# Patient Record
Sex: Female | Born: 1935 | Race: White | Hispanic: No | Smoking: Never smoker
Health system: Southern US, Community
[De-identification: ages and names within clinical notes are randomized; demographics above are authoritative.]

## PROBLEM LIST (undated history)

## (undated) DIAGNOSIS — M810 Age-related osteoporosis without current pathological fracture: Secondary | ICD-10-CM

## (undated) DIAGNOSIS — E119 Type 2 diabetes mellitus without complications: Secondary | ICD-10-CM

## (undated) DIAGNOSIS — I739 Peripheral vascular disease, unspecified: Secondary | ICD-10-CM

## (undated) DIAGNOSIS — N951 Menopausal and female climacteric states: Secondary | ICD-10-CM

## (undated) DIAGNOSIS — I10 Essential (primary) hypertension: Secondary | ICD-10-CM

## (undated) HISTORY — DX: Menopausal and female climacteric states: N95.1

## (undated) HISTORY — DX: Type 2 diabetes mellitus without complications: E11.9

## (undated) HISTORY — DX: Peripheral vascular disease, unspecified: I73.9

## (undated) HISTORY — DX: Essential (primary) hypertension: I10

## (undated) HISTORY — DX: Age-related osteoporosis without current pathological fracture: M81.0

---

## 1998-03-06 ENCOUNTER — Ambulatory Visit (HOSPITAL_COMMUNITY): Admission: RE | Admit: 1998-03-06 | Discharge: 1998-03-06 | Payer: Self-pay | Admitting: Otolaryngology

## 1999-03-15 ENCOUNTER — Ambulatory Visit (HOSPITAL_COMMUNITY): Admission: RE | Admit: 1999-03-15 | Discharge: 1999-03-15 | Payer: Self-pay | Admitting: *Deleted

## 2000-05-06 ENCOUNTER — Other Ambulatory Visit: Admission: RE | Admit: 2000-05-06 | Discharge: 2000-05-06 | Payer: Self-pay | Admitting: *Deleted

## 2002-05-07 ENCOUNTER — Encounter: Admission: RE | Admit: 2002-05-07 | Discharge: 2002-05-07 | Payer: Self-pay | Admitting: Internal Medicine

## 2002-05-07 ENCOUNTER — Encounter: Payer: Self-pay | Admitting: Internal Medicine

## 2003-06-09 ENCOUNTER — Ambulatory Visit (HOSPITAL_COMMUNITY): Admission: RE | Admit: 2003-06-09 | Discharge: 2003-06-09 | Payer: Self-pay | Admitting: Internal Medicine

## 2003-07-18 ENCOUNTER — Other Ambulatory Visit: Admission: RE | Admit: 2003-07-18 | Discharge: 2003-07-18 | Payer: Self-pay | Admitting: Internal Medicine

## 2004-06-01 ENCOUNTER — Ambulatory Visit (HOSPITAL_COMMUNITY): Admission: RE | Admit: 2004-06-01 | Discharge: 2004-06-01 | Payer: Self-pay | Admitting: *Deleted

## 2004-09-06 ENCOUNTER — Ambulatory Visit (HOSPITAL_COMMUNITY): Admission: RE | Admit: 2004-09-06 | Discharge: 2004-09-06 | Payer: Self-pay | Admitting: Internal Medicine

## 2005-11-04 ENCOUNTER — Ambulatory Visit (HOSPITAL_COMMUNITY): Admission: RE | Admit: 2005-11-04 | Discharge: 2005-11-04 | Payer: Self-pay | Admitting: Internal Medicine

## 2005-11-18 ENCOUNTER — Encounter: Admission: RE | Admit: 2005-11-18 | Discharge: 2005-11-18 | Payer: Self-pay | Admitting: Internal Medicine

## 2006-07-22 ENCOUNTER — Other Ambulatory Visit: Admission: RE | Admit: 2006-07-22 | Discharge: 2006-07-22 | Payer: Self-pay | Admitting: Internal Medicine

## 2006-12-08 ENCOUNTER — Ambulatory Visit (HOSPITAL_COMMUNITY): Admission: RE | Admit: 2006-12-08 | Discharge: 2006-12-08 | Payer: Self-pay | Admitting: Internal Medicine

## 2007-12-24 ENCOUNTER — Ambulatory Visit (HOSPITAL_COMMUNITY): Admission: RE | Admit: 2007-12-24 | Discharge: 2007-12-24 | Payer: Self-pay | Admitting: Internal Medicine

## 2009-01-16 ENCOUNTER — Ambulatory Visit (HOSPITAL_COMMUNITY): Admission: RE | Admit: 2009-01-16 | Discharge: 2009-01-16 | Payer: Self-pay | Admitting: Internal Medicine

## 2010-01-29 ENCOUNTER — Ambulatory Visit (HOSPITAL_COMMUNITY): Admission: RE | Admit: 2010-01-29 | Discharge: 2010-01-29 | Payer: Self-pay | Admitting: Internal Medicine

## 2010-07-16 ENCOUNTER — Encounter: Admission: RE | Admit: 2010-07-16 | Discharge: 2010-07-16 | Payer: Self-pay | Admitting: Internal Medicine

## 2010-09-24 ENCOUNTER — Other Ambulatory Visit: Payer: Self-pay | Admitting: Internal Medicine

## 2010-09-26 ENCOUNTER — Other Ambulatory Visit: Payer: Self-pay | Admitting: Internal Medicine

## 2010-09-26 DIAGNOSIS — E042 Nontoxic multinodular goiter: Secondary | ICD-10-CM

## 2010-10-02 ENCOUNTER — Ambulatory Visit
Admission: RE | Admit: 2010-10-02 | Discharge: 2010-10-02 | Disposition: A | Discharge status: Home / Self Care | Payer: Medicare Other | Source: Ambulatory Visit | Attending: Internal Medicine | Admitting: Internal Medicine

## 2010-10-02 DIAGNOSIS — E042 Nontoxic multinodular goiter: Secondary | ICD-10-CM

## 2010-12-28 NOTE — Op Note (Signed)
[name], Angel Forbes                 ACCOUNT NO.:  [id number]   MEDICAL RECORD NO.:  [id number]          PATIENT TYPE:  AMB   LOCATION:  ENDO                         FACILITY:  [facility]   PHYSICIAN:  Georgiana Spinner, M.D.    DATE OF BIRTH:  1929-08-15   DATE OF PROCEDURE:  06/01/2004  DATE OF DISCHARGE:                                 OPERATIVE REPORT   PROCEDURE:  Colonoscopy.   INDICATIONS:  Colon cancer screening.   ANESTHESIA:  Demerol 50 mg, Versed 5 mg.   PROCEDURE:  With the patient mildly sedated in the left lateral decubitus  position, the Olympus videoscopic colonoscope was inserted in the rectum and  passed under direct vision to the cecum, identified by the ileocecal valve  and appendiceal oropharynx, both of which were photographed. From this  point, the colonoscope was slowly withdrawn, taking circumferential views of  the colonic mucosa, stopping only in the rectum which appeared normal on  direct and retroflexed view. The endoscope was then straightened and  withdrawn. The patient's vital signs and pulse oximetry remained stable. The  patient tolerated the procedure well without apparent complications.   FINDINGS:  Occasional diverticulum in the sigmoid colon, otherwise an  unremarkable examination.   PLAN:  Have patient follow up with me in approximately 5 to 10 years.      GMO/MEDQ  D:  06/01/2004  T:  06/01/2004  Job:  [id number]

## 2011-01-29 ENCOUNTER — Other Ambulatory Visit (HOSPITAL_COMMUNITY): Payer: Self-pay | Admitting: Internal Medicine

## 2011-01-29 DIAGNOSIS — Z1231 Encounter for screening mammogram for malignant neoplasm of breast: Secondary | ICD-10-CM

## 2011-02-08 ENCOUNTER — Ambulatory Visit (HOSPITAL_COMMUNITY)
Admission: RE | Admit: 2011-02-08 | Discharge: 2011-02-08 | Disposition: A | Discharge status: Home / Self Care | Payer: Medicare Other | Source: Ambulatory Visit | Attending: Internal Medicine | Admitting: Internal Medicine

## 2011-02-08 DIAGNOSIS — Z1231 Encounter for screening mammogram for malignant neoplasm of breast: Secondary | ICD-10-CM | POA: Insufficient documentation

## 2011-10-07 DIAGNOSIS — H35359 Cystoid macular degeneration, unspecified eye: Secondary | ICD-10-CM | POA: Diagnosis not present

## 2011-10-07 DIAGNOSIS — H26499 Other secondary cataract, unspecified eye: Secondary | ICD-10-CM | POA: Diagnosis not present

## 2011-10-07 DIAGNOSIS — H35039 Hypertensive retinopathy, unspecified eye: Secondary | ICD-10-CM | POA: Diagnosis not present

## 2011-10-07 DIAGNOSIS — H348392 Tributary (branch) retinal vein occlusion, unspecified eye, stable: Secondary | ICD-10-CM | POA: Diagnosis not present

## 2011-10-07 DIAGNOSIS — H524 Presbyopia: Secondary | ICD-10-CM | POA: Diagnosis not present

## 2011-10-07 DIAGNOSIS — H40019 Open angle with borderline findings, low risk, unspecified eye: Secondary | ICD-10-CM | POA: Diagnosis not present

## 2011-10-31 DIAGNOSIS — E559 Vitamin D deficiency, unspecified: Secondary | ICD-10-CM | POA: Diagnosis not present

## 2011-10-31 DIAGNOSIS — E119 Type 2 diabetes mellitus without complications: Secondary | ICD-10-CM | POA: Diagnosis not present

## 2011-10-31 DIAGNOSIS — N390 Urinary tract infection, site not specified: Secondary | ICD-10-CM | POA: Diagnosis not present

## 2011-11-05 DIAGNOSIS — M899 Disorder of bone, unspecified: Secondary | ICD-10-CM | POA: Diagnosis not present

## 2011-11-05 DIAGNOSIS — E119 Type 2 diabetes mellitus without complications: Secondary | ICD-10-CM | POA: Diagnosis not present

## 2011-11-05 DIAGNOSIS — I10 Essential (primary) hypertension: Secondary | ICD-10-CM | POA: Diagnosis not present

## 2011-11-05 DIAGNOSIS — M949 Disorder of cartilage, unspecified: Secondary | ICD-10-CM | POA: Diagnosis not present

## 2011-11-11 ENCOUNTER — Other Ambulatory Visit: Payer: Self-pay | Admitting: Internal Medicine

## 2011-11-11 DIAGNOSIS — E041 Nontoxic single thyroid nodule: Secondary | ICD-10-CM

## 2011-12-02 ENCOUNTER — Ambulatory Visit
Admission: RE | Admit: 2011-12-02 | Discharge: 2011-12-02 | Disposition: A | Discharge status: Home / Self Care | Payer: Medicare Other | Source: Ambulatory Visit | Attending: Internal Medicine | Admitting: Internal Medicine

## 2011-12-02 DIAGNOSIS — E042 Nontoxic multinodular goiter: Secondary | ICD-10-CM | POA: Diagnosis not present

## 2011-12-02 DIAGNOSIS — E041 Nontoxic single thyroid nodule: Secondary | ICD-10-CM

## 2012-01-27 ENCOUNTER — Other Ambulatory Visit (HOSPITAL_COMMUNITY): Payer: Self-pay | Admitting: Internal Medicine

## 2012-01-27 DIAGNOSIS — Z1231 Encounter for screening mammogram for malignant neoplasm of breast: Secondary | ICD-10-CM

## 2012-02-24 ENCOUNTER — Ambulatory Visit (HOSPITAL_COMMUNITY)
Admission: RE | Admit: 2012-02-24 | Discharge: 2012-02-24 | Disposition: A | Discharge status: Home / Self Care | Payer: Medicare Other | Source: Ambulatory Visit | Attending: Internal Medicine | Admitting: Internal Medicine

## 2012-02-24 DIAGNOSIS — Z1231 Encounter for screening mammogram for malignant neoplasm of breast: Secondary | ICD-10-CM

## 2012-03-04 DIAGNOSIS — H40019 Open angle with borderline findings, low risk, unspecified eye: Secondary | ICD-10-CM | POA: Diagnosis not present

## 2012-03-04 DIAGNOSIS — H02409 Unspecified ptosis of unspecified eyelid: Secondary | ICD-10-CM | POA: Diagnosis not present

## 2012-05-04 DIAGNOSIS — D509 Iron deficiency anemia, unspecified: Secondary | ICD-10-CM | POA: Diagnosis not present

## 2012-05-04 DIAGNOSIS — E119 Type 2 diabetes mellitus without complications: Secondary | ICD-10-CM | POA: Diagnosis not present

## 2012-05-07 DIAGNOSIS — D509 Iron deficiency anemia, unspecified: Secondary | ICD-10-CM | POA: Diagnosis not present

## 2012-05-07 DIAGNOSIS — E119 Type 2 diabetes mellitus without complications: Secondary | ICD-10-CM | POA: Diagnosis not present

## 2012-05-07 DIAGNOSIS — Z23 Encounter for immunization: Secondary | ICD-10-CM | POA: Diagnosis not present

## 2012-05-07 DIAGNOSIS — I10 Essential (primary) hypertension: Secondary | ICD-10-CM | POA: Diagnosis not present

## 2012-05-19 DIAGNOSIS — Z1212 Encounter for screening for malignant neoplasm of rectum: Secondary | ICD-10-CM | POA: Diagnosis not present

## 2012-11-09 DIAGNOSIS — D509 Iron deficiency anemia, unspecified: Secondary | ICD-10-CM | POA: Diagnosis not present

## 2012-11-09 DIAGNOSIS — E119 Type 2 diabetes mellitus without complications: Secondary | ICD-10-CM | POA: Diagnosis not present

## 2012-11-09 DIAGNOSIS — N390 Urinary tract infection, site not specified: Secondary | ICD-10-CM | POA: Diagnosis not present

## 2012-11-09 DIAGNOSIS — E559 Vitamin D deficiency, unspecified: Secondary | ICD-10-CM | POA: Diagnosis not present

## 2012-11-12 DIAGNOSIS — E785 Hyperlipidemia, unspecified: Secondary | ICD-10-CM | POA: Diagnosis not present

## 2012-11-12 DIAGNOSIS — E119 Type 2 diabetes mellitus without complications: Secondary | ICD-10-CM | POA: Diagnosis not present

## 2012-11-12 DIAGNOSIS — E559 Vitamin D deficiency, unspecified: Secondary | ICD-10-CM | POA: Diagnosis not present

## 2012-11-12 DIAGNOSIS — Z0000 Encounter for general adult medical examination without abnormal findings: Secondary | ICD-10-CM | POA: Diagnosis not present

## 2013-02-09 DIAGNOSIS — E119 Type 2 diabetes mellitus without complications: Secondary | ICD-10-CM | POA: Diagnosis not present

## 2013-02-09 DIAGNOSIS — E785 Hyperlipidemia, unspecified: Secondary | ICD-10-CM | POA: Diagnosis not present

## 2013-02-09 DIAGNOSIS — N390 Urinary tract infection, site not specified: Secondary | ICD-10-CM | POA: Diagnosis not present

## 2013-02-17 ENCOUNTER — Other Ambulatory Visit (HOSPITAL_COMMUNITY): Payer: Self-pay | Admitting: Internal Medicine

## 2013-02-17 DIAGNOSIS — Z1231 Encounter for screening mammogram for malignant neoplasm of breast: Secondary | ICD-10-CM

## 2013-03-04 ENCOUNTER — Ambulatory Visit (HOSPITAL_COMMUNITY)
Admission: RE | Admit: 2013-03-04 | Discharge: 2013-03-04 | Disposition: A | Discharge status: Home / Self Care | Payer: Medicare Other | Source: Ambulatory Visit | Attending: Internal Medicine | Admitting: Internal Medicine

## 2013-03-04 DIAGNOSIS — Z1231 Encounter for screening mammogram for malignant neoplasm of breast: Secondary | ICD-10-CM | POA: Insufficient documentation

## 2013-05-13 DIAGNOSIS — E785 Hyperlipidemia, unspecified: Secondary | ICD-10-CM | POA: Diagnosis not present

## 2013-05-13 DIAGNOSIS — N390 Urinary tract infection, site not specified: Secondary | ICD-10-CM | POA: Diagnosis not present

## 2013-05-13 DIAGNOSIS — E119 Type 2 diabetes mellitus without complications: Secondary | ICD-10-CM | POA: Diagnosis not present

## 2013-05-18 DIAGNOSIS — E119 Type 2 diabetes mellitus without complications: Secondary | ICD-10-CM | POA: Diagnosis not present

## 2013-05-18 DIAGNOSIS — Z23 Encounter for immunization: Secondary | ICD-10-CM | POA: Diagnosis not present

## 2013-05-18 DIAGNOSIS — I10 Essential (primary) hypertension: Secondary | ICD-10-CM | POA: Diagnosis not present

## 2013-12-31 DIAGNOSIS — E119 Type 2 diabetes mellitus without complications: Secondary | ICD-10-CM | POA: Diagnosis not present

## 2013-12-31 DIAGNOSIS — E785 Hyperlipidemia, unspecified: Secondary | ICD-10-CM | POA: Diagnosis not present

## 2014-01-06 DIAGNOSIS — Z1331 Encounter for screening for depression: Secondary | ICD-10-CM | POA: Diagnosis not present

## 2014-01-06 DIAGNOSIS — I10 Essential (primary) hypertension: Secondary | ICD-10-CM | POA: Diagnosis not present

## 2014-01-06 DIAGNOSIS — Z0000 Encounter for general adult medical examination without abnormal findings: Secondary | ICD-10-CM | POA: Diagnosis not present

## 2014-01-06 DIAGNOSIS — E1129 Type 2 diabetes mellitus with other diabetic kidney complication: Secondary | ICD-10-CM | POA: Diagnosis not present

## 2014-02-17 ENCOUNTER — Other Ambulatory Visit (HOSPITAL_COMMUNITY): Payer: Self-pay | Admitting: Internal Medicine

## 2014-02-17 DIAGNOSIS — Z1231 Encounter for screening mammogram for malignant neoplasm of breast: Secondary | ICD-10-CM

## 2014-03-07 ENCOUNTER — Ambulatory Visit (HOSPITAL_COMMUNITY)
Admission: RE | Admit: 2014-03-07 | Discharge: 2014-03-07 | Disposition: A | Discharge status: Home / Self Care | Payer: Medicare Other | Source: Ambulatory Visit | Attending: Internal Medicine | Admitting: Internal Medicine

## 2014-03-07 DIAGNOSIS — Z1231 Encounter for screening mammogram for malignant neoplasm of breast: Secondary | ICD-10-CM | POA: Insufficient documentation

## 2014-03-25 DIAGNOSIS — H40019 Open angle with borderline findings, low risk, unspecified eye: Secondary | ICD-10-CM | POA: Diagnosis not present

## 2014-03-25 DIAGNOSIS — H35359 Cystoid macular degeneration, unspecified eye: Secondary | ICD-10-CM | POA: Diagnosis not present

## 2014-03-25 DIAGNOSIS — E118 Type 2 diabetes mellitus with unspecified complications: Secondary | ICD-10-CM | POA: Diagnosis not present

## 2014-03-25 DIAGNOSIS — H35039 Hypertensive retinopathy, unspecified eye: Secondary | ICD-10-CM | POA: Diagnosis not present

## 2014-03-25 DIAGNOSIS — H524 Presbyopia: Secondary | ICD-10-CM | POA: Diagnosis not present

## 2014-03-25 DIAGNOSIS — H26499 Other secondary cataract, unspecified eye: Secondary | ICD-10-CM | POA: Diagnosis not present

## 2014-04-19 DIAGNOSIS — E11329 Type 2 diabetes mellitus with mild nonproliferative diabetic retinopathy without macular edema: Secondary | ICD-10-CM | POA: Diagnosis not present

## 2014-04-19 DIAGNOSIS — H35049 Retinal micro-aneurysms, unspecified, unspecified eye: Secondary | ICD-10-CM | POA: Diagnosis not present

## 2014-04-19 DIAGNOSIS — H3560 Retinal hemorrhage, unspecified eye: Secondary | ICD-10-CM | POA: Diagnosis not present

## 2014-04-19 DIAGNOSIS — E1139 Type 2 diabetes mellitus with other diabetic ophthalmic complication: Secondary | ICD-10-CM | POA: Diagnosis not present

## 2014-05-09 DIAGNOSIS — H348392 Tributary (branch) retinal vein occlusion, unspecified eye, stable: Secondary | ICD-10-CM | POA: Diagnosis not present

## 2014-05-09 DIAGNOSIS — H3581 Retinal edema: Secondary | ICD-10-CM | POA: Diagnosis not present

## 2014-05-09 DIAGNOSIS — E11329 Type 2 diabetes mellitus with mild nonproliferative diabetic retinopathy without macular edema: Secondary | ICD-10-CM | POA: Diagnosis not present

## 2014-05-09 DIAGNOSIS — E1139 Type 2 diabetes mellitus with other diabetic ophthalmic complication: Secondary | ICD-10-CM | POA: Diagnosis not present

## 2014-05-12 DIAGNOSIS — H34832 Tributary (branch) retinal vein occlusion, left eye: Secondary | ICD-10-CM | POA: Diagnosis not present

## 2014-05-12 DIAGNOSIS — H3581 Retinal edema: Secondary | ICD-10-CM | POA: Diagnosis not present

## 2014-06-11 DIAGNOSIS — Z23 Encounter for immunization: Secondary | ICD-10-CM | POA: Diagnosis not present

## 2014-06-20 DIAGNOSIS — H34832 Tributary (branch) retinal vein occlusion, left eye: Secondary | ICD-10-CM | POA: Diagnosis not present

## 2014-07-06 DIAGNOSIS — E1129 Type 2 diabetes mellitus with other diabetic kidney complication: Secondary | ICD-10-CM | POA: Diagnosis not present

## 2014-07-06 DIAGNOSIS — R809 Proteinuria, unspecified: Secondary | ICD-10-CM | POA: Diagnosis not present

## 2014-07-06 DIAGNOSIS — E559 Vitamin D deficiency, unspecified: Secondary | ICD-10-CM | POA: Diagnosis not present

## 2014-07-12 DIAGNOSIS — R809 Proteinuria, unspecified: Secondary | ICD-10-CM | POA: Diagnosis not present

## 2014-07-12 DIAGNOSIS — H6123 Impacted cerumen, bilateral: Secondary | ICD-10-CM | POA: Diagnosis not present

## 2014-07-12 DIAGNOSIS — E1165 Type 2 diabetes mellitus with hyperglycemia: Secondary | ICD-10-CM | POA: Diagnosis not present

## 2014-08-09 DIAGNOSIS — H3581 Retinal edema: Secondary | ICD-10-CM | POA: Diagnosis not present

## 2014-08-09 DIAGNOSIS — H34832 Tributary (branch) retinal vein occlusion, left eye: Secondary | ICD-10-CM | POA: Diagnosis not present

## 2014-10-03 DIAGNOSIS — H34832 Tributary (branch) retinal vein occlusion, left eye: Secondary | ICD-10-CM | POA: Diagnosis not present

## 2014-12-12 DIAGNOSIS — H34832 Tributary (branch) retinal vein occlusion, left eye: Secondary | ICD-10-CM | POA: Diagnosis not present

## 2014-12-13 DIAGNOSIS — R809 Proteinuria, unspecified: Secondary | ICD-10-CM | POA: Diagnosis not present

## 2014-12-13 DIAGNOSIS — E785 Hyperlipidemia, unspecified: Secondary | ICD-10-CM | POA: Diagnosis not present

## 2014-12-13 DIAGNOSIS — E1165 Type 2 diabetes mellitus with hyperglycemia: Secondary | ICD-10-CM | POA: Diagnosis not present

## 2014-12-19 DIAGNOSIS — Z0000 Encounter for general adult medical examination without abnormal findings: Secondary | ICD-10-CM | POA: Diagnosis not present

## 2014-12-19 DIAGNOSIS — Z23 Encounter for immunization: Secondary | ICD-10-CM | POA: Diagnosis not present

## 2014-12-19 DIAGNOSIS — Z1389 Encounter for screening for other disorder: Secondary | ICD-10-CM | POA: Diagnosis not present

## 2014-12-19 DIAGNOSIS — E1165 Type 2 diabetes mellitus with hyperglycemia: Secondary | ICD-10-CM | POA: Diagnosis not present

## 2014-12-19 DIAGNOSIS — M8580 Other specified disorders of bone density and structure, unspecified site: Secondary | ICD-10-CM | POA: Diagnosis not present

## 2015-02-10 ENCOUNTER — Other Ambulatory Visit (HOSPITAL_COMMUNITY): Payer: Self-pay | Admitting: Internal Medicine

## 2015-02-10 DIAGNOSIS — Z1231 Encounter for screening mammogram for malignant neoplasm of breast: Secondary | ICD-10-CM

## 2015-03-13 ENCOUNTER — Ambulatory Visit (HOSPITAL_COMMUNITY)
Admission: RE | Admit: 2015-03-13 | Discharge: 2015-03-13 | Disposition: A | Discharge status: Home / Self Care | Payer: Medicare Other | Source: Ambulatory Visit | Attending: Internal Medicine | Admitting: Internal Medicine

## 2015-03-13 ENCOUNTER — Ambulatory Visit (HOSPITAL_COMMUNITY): Payer: Medicare Other

## 2015-03-13 DIAGNOSIS — Z1231 Encounter for screening mammogram for malignant neoplasm of breast: Secondary | ICD-10-CM | POA: Insufficient documentation

## 2015-03-13 DIAGNOSIS — H34832 Tributary (branch) retinal vein occlusion, left eye: Secondary | ICD-10-CM | POA: Diagnosis not present

## 2015-03-13 DIAGNOSIS — E11329 Type 2 diabetes mellitus with mild nonproliferative diabetic retinopathy without macular edema: Secondary | ICD-10-CM | POA: Diagnosis not present

## 2015-03-13 DIAGNOSIS — H3581 Retinal edema: Secondary | ICD-10-CM | POA: Diagnosis not present

## 2015-05-26 DIAGNOSIS — Z23 Encounter for immunization: Secondary | ICD-10-CM | POA: Diagnosis not present

## 2015-06-12 DIAGNOSIS — E049 Nontoxic goiter, unspecified: Secondary | ICD-10-CM | POA: Diagnosis not present

## 2015-06-12 DIAGNOSIS — E559 Vitamin D deficiency, unspecified: Secondary | ICD-10-CM | POA: Diagnosis not present

## 2015-06-12 DIAGNOSIS — N390 Urinary tract infection, site not specified: Secondary | ICD-10-CM | POA: Diagnosis not present

## 2015-06-12 DIAGNOSIS — M8580 Other specified disorders of bone density and structure, unspecified site: Secondary | ICD-10-CM | POA: Diagnosis not present

## 2015-06-12 DIAGNOSIS — E1165 Type 2 diabetes mellitus with hyperglycemia: Secondary | ICD-10-CM | POA: Diagnosis not present

## 2015-06-12 DIAGNOSIS — I10 Essential (primary) hypertension: Secondary | ICD-10-CM | POA: Diagnosis not present

## 2015-06-19 DIAGNOSIS — Z8249 Family history of ischemic heart disease and other diseases of the circulatory system: Secondary | ICD-10-CM | POA: Diagnosis not present

## 2015-06-19 DIAGNOSIS — I10 Essential (primary) hypertension: Secondary | ICD-10-CM | POA: Diagnosis not present

## 2015-06-19 DIAGNOSIS — E559 Vitamin D deficiency, unspecified: Secondary | ICD-10-CM | POA: Diagnosis not present

## 2015-06-19 DIAGNOSIS — E1129 Type 2 diabetes mellitus with other diabetic kidney complication: Secondary | ICD-10-CM | POA: Diagnosis not present

## 2015-07-17 DIAGNOSIS — E113293 Type 2 diabetes mellitus with mild nonproliferative diabetic retinopathy without macular edema, bilateral: Secondary | ICD-10-CM | POA: Diagnosis not present

## 2015-07-17 DIAGNOSIS — H34832 Tributary (branch) retinal vein occlusion, left eye, with macular edema: Secondary | ICD-10-CM | POA: Diagnosis not present

## 2015-07-17 DIAGNOSIS — H3581 Retinal edema: Secondary | ICD-10-CM | POA: Diagnosis not present

## 2015-08-03 DIAGNOSIS — Z0000 Encounter for general adult medical examination without abnormal findings: Secondary | ICD-10-CM | POA: Diagnosis not present

## 2015-08-03 DIAGNOSIS — I10 Essential (primary) hypertension: Secondary | ICD-10-CM | POA: Diagnosis not present

## 2015-08-03 DIAGNOSIS — E1165 Type 2 diabetes mellitus with hyperglycemia: Secondary | ICD-10-CM | POA: Diagnosis not present

## 2015-08-03 DIAGNOSIS — E559 Vitamin D deficiency, unspecified: Secondary | ICD-10-CM | POA: Diagnosis not present

## 2015-08-03 DIAGNOSIS — N390 Urinary tract infection, site not specified: Secondary | ICD-10-CM | POA: Diagnosis not present

## 2015-08-03 DIAGNOSIS — M8580 Other specified disorders of bone density and structure, unspecified site: Secondary | ICD-10-CM | POA: Diagnosis not present

## 2015-08-10 DIAGNOSIS — M8580 Other specified disorders of bone density and structure, unspecified site: Secondary | ICD-10-CM | POA: Diagnosis not present

## 2015-08-10 DIAGNOSIS — I10 Essential (primary) hypertension: Secondary | ICD-10-CM | POA: Diagnosis not present

## 2015-08-10 DIAGNOSIS — H6121 Impacted cerumen, right ear: Secondary | ICD-10-CM | POA: Diagnosis not present

## 2015-08-10 DIAGNOSIS — Z1212 Encounter for screening for malignant neoplasm of rectum: Secondary | ICD-10-CM | POA: Diagnosis not present

## 2015-08-10 DIAGNOSIS — R350 Frequency of micturition: Secondary | ICD-10-CM | POA: Diagnosis not present

## 2015-08-10 DIAGNOSIS — E042 Nontoxic multinodular goiter: Secondary | ICD-10-CM | POA: Diagnosis not present

## 2015-08-28 DIAGNOSIS — M79674 Pain in right toe(s): Secondary | ICD-10-CM | POA: Diagnosis not present

## 2015-08-28 DIAGNOSIS — L03031 Cellulitis of right toe: Secondary | ICD-10-CM | POA: Diagnosis not present

## 2015-10-05 DIAGNOSIS — H26491 Other secondary cataract, right eye: Secondary | ICD-10-CM | POA: Diagnosis not present

## 2015-10-05 DIAGNOSIS — E118 Type 2 diabetes mellitus with unspecified complications: Secondary | ICD-10-CM | POA: Diagnosis not present

## 2015-10-05 DIAGNOSIS — H40013 Open angle with borderline findings, low risk, bilateral: Secondary | ICD-10-CM | POA: Diagnosis not present

## 2015-12-06 DIAGNOSIS — L02611 Cutaneous abscess of right foot: Secondary | ICD-10-CM | POA: Diagnosis not present

## 2015-12-06 DIAGNOSIS — M79674 Pain in right toe(s): Secondary | ICD-10-CM | POA: Diagnosis not present

## 2015-12-06 DIAGNOSIS — L03031 Cellulitis of right toe: Secondary | ICD-10-CM | POA: Diagnosis not present

## 2015-12-08 DIAGNOSIS — I10 Essential (primary) hypertension: Secondary | ICD-10-CM | POA: Diagnosis not present

## 2015-12-08 DIAGNOSIS — E119 Type 2 diabetes mellitus without complications: Secondary | ICD-10-CM | POA: Diagnosis not present

## 2015-12-21 DIAGNOSIS — L03031 Cellulitis of right toe: Secondary | ICD-10-CM | POA: Diagnosis not present

## 2016-01-02 DIAGNOSIS — L03032 Cellulitis of left toe: Secondary | ICD-10-CM | POA: Diagnosis not present

## 2016-01-02 DIAGNOSIS — L03031 Cellulitis of right toe: Secondary | ICD-10-CM | POA: Diagnosis not present

## 2016-01-11 DIAGNOSIS — H348322 Tributary (branch) retinal vein occlusion, left eye, stable: Secondary | ICD-10-CM | POA: Diagnosis not present

## 2016-01-11 DIAGNOSIS — E113293 Type 2 diabetes mellitus with mild nonproliferative diabetic retinopathy without macular edema, bilateral: Secondary | ICD-10-CM | POA: Diagnosis not present

## 2016-01-11 DIAGNOSIS — H359 Unspecified retinal disorder: Secondary | ICD-10-CM | POA: Diagnosis not present

## 2016-01-11 DIAGNOSIS — H3581 Retinal edema: Secondary | ICD-10-CM | POA: Diagnosis not present

## 2016-01-18 DIAGNOSIS — M810 Age-related osteoporosis without current pathological fracture: Secondary | ICD-10-CM | POA: Diagnosis not present

## 2016-02-08 DIAGNOSIS — M8580 Other specified disorders of bone density and structure, unspecified site: Secondary | ICD-10-CM | POA: Diagnosis not present

## 2016-03-08 ENCOUNTER — Other Ambulatory Visit: Payer: Self-pay | Admitting: Internal Medicine

## 2016-03-08 DIAGNOSIS — Z1231 Encounter for screening mammogram for malignant neoplasm of breast: Secondary | ICD-10-CM

## 2016-04-01 ENCOUNTER — Ambulatory Visit
Admission: RE | Admit: 2016-04-01 | Discharge: 2016-04-01 | Disposition: A | Discharge status: Home / Self Care | Payer: Medicare Other | Source: Ambulatory Visit | Attending: Internal Medicine | Admitting: Internal Medicine

## 2016-04-01 DIAGNOSIS — Z1231 Encounter for screening mammogram for malignant neoplasm of breast: Secondary | ICD-10-CM | POA: Diagnosis not present

## 2016-04-03 DIAGNOSIS — E119 Type 2 diabetes mellitus without complications: Secondary | ICD-10-CM | POA: Diagnosis not present

## 2016-04-04 DIAGNOSIS — H02402 Unspecified ptosis of left eyelid: Secondary | ICD-10-CM | POA: Diagnosis not present

## 2016-04-04 DIAGNOSIS — H40013 Open angle with borderline findings, low risk, bilateral: Secondary | ICD-10-CM | POA: Diagnosis not present

## 2016-04-09 DIAGNOSIS — M810 Age-related osteoporosis without current pathological fracture: Secondary | ICD-10-CM | POA: Diagnosis not present

## 2016-04-09 DIAGNOSIS — E119 Type 2 diabetes mellitus without complications: Secondary | ICD-10-CM | POA: Diagnosis not present

## 2016-04-09 DIAGNOSIS — I10 Essential (primary) hypertension: Secondary | ICD-10-CM | POA: Diagnosis not present

## 2016-04-09 DIAGNOSIS — Z23 Encounter for immunization: Secondary | ICD-10-CM | POA: Diagnosis not present

## 2016-08-13 DIAGNOSIS — R319 Hematuria, unspecified: Secondary | ICD-10-CM | POA: Diagnosis not present

## 2016-08-13 DIAGNOSIS — M8580 Other specified disorders of bone density and structure, unspecified site: Secondary | ICD-10-CM | POA: Diagnosis not present

## 2016-08-13 DIAGNOSIS — E559 Vitamin D deficiency, unspecified: Secondary | ICD-10-CM | POA: Diagnosis not present

## 2016-08-13 DIAGNOSIS — Z7982 Long term (current) use of aspirin: Secondary | ICD-10-CM | POA: Diagnosis not present

## 2016-08-13 DIAGNOSIS — N390 Urinary tract infection, site not specified: Secondary | ICD-10-CM | POA: Diagnosis not present

## 2016-08-13 DIAGNOSIS — Z0000 Encounter for general adult medical examination without abnormal findings: Secondary | ICD-10-CM | POA: Diagnosis not present

## 2016-08-13 DIAGNOSIS — I10 Essential (primary) hypertension: Secondary | ICD-10-CM | POA: Diagnosis not present

## 2016-08-16 DIAGNOSIS — N390 Urinary tract infection, site not specified: Secondary | ICD-10-CM | POA: Diagnosis not present

## 2016-08-16 DIAGNOSIS — E042 Nontoxic multinodular goiter: Secondary | ICD-10-CM | POA: Diagnosis not present

## 2016-08-16 DIAGNOSIS — M8580 Other specified disorders of bone density and structure, unspecified site: Secondary | ICD-10-CM | POA: Diagnosis not present

## 2016-08-16 DIAGNOSIS — I10 Essential (primary) hypertension: Secondary | ICD-10-CM | POA: Diagnosis not present

## 2016-08-16 DIAGNOSIS — E119 Type 2 diabetes mellitus without complications: Secondary | ICD-10-CM | POA: Diagnosis not present

## 2016-08-16 DIAGNOSIS — Z1212 Encounter for screening for malignant neoplasm of rectum: Secondary | ICD-10-CM | POA: Diagnosis not present

## 2016-08-16 DIAGNOSIS — E1165 Type 2 diabetes mellitus with hyperglycemia: Secondary | ICD-10-CM | POA: Diagnosis not present

## 2016-08-26 DIAGNOSIS — M79661 Pain in right lower leg: Secondary | ICD-10-CM | POA: Diagnosis not present

## 2016-08-27 ENCOUNTER — Encounter (HOSPITAL_COMMUNITY): Payer: Medicare Other

## 2016-08-28 ENCOUNTER — Encounter (HOSPITAL_COMMUNITY): Payer: Medicare Other

## 2016-09-19 DIAGNOSIS — I10 Essential (primary) hypertension: Secondary | ICD-10-CM | POA: Diagnosis not present

## 2016-09-19 DIAGNOSIS — R350 Frequency of micturition: Secondary | ICD-10-CM | POA: Diagnosis not present

## 2016-09-19 DIAGNOSIS — N390 Urinary tract infection, site not specified: Secondary | ICD-10-CM | POA: Diagnosis not present

## 2016-10-07 DIAGNOSIS — H40013 Open angle with borderline findings, low risk, bilateral: Secondary | ICD-10-CM | POA: Diagnosis not present

## 2016-10-07 DIAGNOSIS — E118 Type 2 diabetes mellitus with unspecified complications: Secondary | ICD-10-CM | POA: Diagnosis not present

## 2016-10-07 DIAGNOSIS — H34832 Tributary (branch) retinal vein occlusion, left eye, with macular edema: Secondary | ICD-10-CM | POA: Diagnosis not present

## 2016-10-07 DIAGNOSIS — H26492 Other secondary cataract, left eye: Secondary | ICD-10-CM | POA: Diagnosis not present

## 2016-10-24 DIAGNOSIS — R350 Frequency of micturition: Secondary | ICD-10-CM | POA: Diagnosis not present

## 2016-10-24 DIAGNOSIS — I10 Essential (primary) hypertension: Secondary | ICD-10-CM | POA: Diagnosis not present

## 2016-10-24 DIAGNOSIS — N390 Urinary tract infection, site not specified: Secondary | ICD-10-CM | POA: Diagnosis not present

## 2016-10-24 DIAGNOSIS — R300 Dysuria: Secondary | ICD-10-CM | POA: Diagnosis not present

## 2016-10-25 DIAGNOSIS — M79661 Pain in right lower leg: Secondary | ICD-10-CM | POA: Diagnosis not present

## 2016-11-18 ENCOUNTER — Ambulatory Visit (INDEPENDENT_AMBULATORY_CARE_PROVIDER_SITE_OTHER): Payer: Medicare Other | Admitting: Sports Medicine

## 2016-11-18 ENCOUNTER — Encounter (INDEPENDENT_AMBULATORY_CARE_PROVIDER_SITE_OTHER): Payer: Self-pay

## 2016-11-18 ENCOUNTER — Encounter: Payer: Self-pay | Admitting: Sports Medicine

## 2016-11-18 VITALS — BP 130/70 | Ht 61.0 in | Wt 117.0 lb

## 2016-11-18 DIAGNOSIS — M5416 Radiculopathy, lumbar region: Secondary | ICD-10-CM | POA: Diagnosis present

## 2016-11-18 NOTE — Progress Notes (Signed)
   Subjective:    Patient ID: Angel Forbes, female    DOB: 30-Jul-1930, 5 y.oMarilu [id number]  HPI chief complaint: Right calf pain  Very pleasant 83 year old female comes in today complaining of 3 months of right calf pain. Her pain began after she stepped down from a table awkwardly twisting her right leg. Later that day, she began to experience pain in the right side of her low back with radiating pain down the right thigh into the right calf. Since that time her low back pain and right thigh pain have resolved but her calf pain persists. It is primarily along the lateral calf. It is worse with walking and bowling. Improves with sitting. No pain at night. She takes an occasional Aleve before bowling and thinks that it may help. She denies any bruising or swelling in her calf. No weakness. She did initially noticed some numbness and tingling but that has since resolved. No similar issues in the past.  Past medical history reviewed Medications reviewed Allergies reviewed    Review of Systems As above    Objective:   Physical Exam  Well-developed, well-nourished. No acute distress  Right calf: No soft tissue swelling. No palpable defect. No tenderness to palpation.  Lumbar spine: Full lumbar flexion. Limited extension. No tenderness to palpation.  Neurological exam: Patient has 2/4 patellar reflexes bilaterally. 2/4 Achilles reflex on the left but absent Achilles reflex on the right. 4/5 strength with resisted plantar flexion on the right compared to 5/5 on the left. Good strength with resisted great toe extension and ankle dorsiflexion. Neurovascularly intact distally.      Assessment & Plan:   Right calf pain, likely referred pain from the lumbar spine  Would like to start with some physical therapy at [facility]. Patient will follow-up with me in 6 weeks for reevaluation. She tells me that her bowling league will be coming to a conclusion in 4 weeks. I think she is okay  to continue with activity using pain as her guide while she starts physical therapy. I discussed pursuing imaging of her lumbar spine if her pain does not improve. Call with questions or concerns prior to her follow-up visit.

## 2016-12-24 DIAGNOSIS — G8929 Other chronic pain: Secondary | ICD-10-CM | POA: Diagnosis not present

## 2016-12-24 DIAGNOSIS — M5441 Lumbago with sciatica, right side: Secondary | ICD-10-CM | POA: Diagnosis not present

## 2016-12-27 DIAGNOSIS — M5416 Radiculopathy, lumbar region: Secondary | ICD-10-CM | POA: Diagnosis not present

## 2016-12-30 ENCOUNTER — Encounter: Payer: Self-pay | Admitting: Sports Medicine

## 2016-12-30 ENCOUNTER — Ambulatory Visit (INDEPENDENT_AMBULATORY_CARE_PROVIDER_SITE_OTHER): Payer: Medicare Other | Admitting: Sports Medicine

## 2016-12-30 VITALS — BP 135/69 | Ht 61.0 in | Wt 117.0 lb

## 2016-12-30 DIAGNOSIS — M5416 Radiculopathy, lumbar region: Secondary | ICD-10-CM | POA: Diagnosis not present

## 2016-12-30 NOTE — Progress Notes (Signed)
Subjective: CC: f/u calf pain / lumbar radiculopathy  HPI: Patient is a 83 y.o. female presenting to clinic today for f/u on lumbar radiculopathy.  Since her last visit the patient states that she has improved. She notes her Pain is almost resolved, but still rates it as a 5-10 pain. She describes it as an aching/tired pain. She feels like her strength is well. She denies any shooting pains from her lumbar region down her leg, but does note significant right posterior thigh pain. She noted this after her last visit, but is unsure if she is just more aware of it after the last appointment.  She states the right posterior thigh pain is present after prolonged standing or walking. She rates it as a follicle 10. The patient denies any she will, no numbness or tingling of the lower extremities, no urinary or bowel incontinence.  Patient is going to physical therapy. This is her second week, she's only gone twice, but has noted significant improvement in her symptoms. She has been doing exercises at home. She will intermittently take aspirin as needed for pain, specifically before bowling. Her bowling season is over, but she intermittently plays at this time.   Social History: Never smoker   ROS: All other systems reviewed and are negative.  Past Medical History There are no active problems to display for this patient.   Medications- reviewed and updated Current Outpatient Prescriptions  Medication Sig Dispense Refill  . alendronate (FOSAMAX) 35 MG tablet     . cephALEXin (KEFLEX) 500 MG capsule     . ciprofloxacin (CIPRO) 250 MG tablet     . losartan-hydrochlorothiazide (HYZAAR) 100-25 MG tablet     . metFORMIN (GLUCOPHAGE) 500 MG tablet     . nitrofurantoin, macrocrystal-monohydrate, (MACROBID) 100 MG capsule      No current facility-administered medications for this visit.     Objective: Office vital signs reviewed. BP 135/69   Ht 5\' 1"  (1.549 m)   Wt 117 lb (53.1 kg)   BMI  22.11 kg/m    Physical Examination:  General: Awake, alert, well- nourished, NAD  Right calf: Normal to inspection. No soft tissue swelling noted. No palpable defect. No tenderness to palpation.  Lumbar spine: Normal to inspection. No tenderness to palpation.  Full flexion in the lumbar region, mildly decreased range of motion in extension.  2/4 patellar reflexes bilaterally, unable to illicit Achilles reflexes bilaterally. 5/5 strength in knee flexion/extension and ankle dorsiflexion and plantar flexion. Neurovascularlly intact distally. Negative straight leg raise  R hip: tenderness over the piriformis / gluteus medius. Weak hip abduction.    Assessment/Plan: 83 y/o presenting for a f/u on lumbar radiculopathy with significant improvement in calf pain, but noted pain in the R hip now.   Right hip pain: Most likely secondary to lumbar pain, may have some component of piriformis syndrome as well. Patient's symptoms seem to be improving with physical therapy. No red flags on exam or history.   -Continue with physical therapy -- will hold off on imaging today given improvement. - patient to schedule a f/u in 4 weeks, will cancel if she continues to improve  Joanna Puffrystal S. Lovelle Deitrick PGY-3, [facility]  Patient seen and evaluated with the resident. I agree with the above plan of care. As long as the patient continues to improve I think we can hold off on imaging. We will schedule a follow-up visit in 4 weeks but she may cancel that appointment if she continues to  improve.

## 2016-12-31 DIAGNOSIS — M5441 Lumbago with sciatica, right side: Secondary | ICD-10-CM | POA: Diagnosis not present

## 2016-12-31 DIAGNOSIS — M5416 Radiculopathy, lumbar region: Secondary | ICD-10-CM | POA: Diagnosis not present

## 2016-12-31 DIAGNOSIS — G8929 Other chronic pain: Secondary | ICD-10-CM | POA: Diagnosis not present

## 2017-01-02 DIAGNOSIS — M5416 Radiculopathy, lumbar region: Secondary | ICD-10-CM | POA: Diagnosis not present

## 2017-01-02 DIAGNOSIS — M5441 Lumbago with sciatica, right side: Secondary | ICD-10-CM | POA: Diagnosis not present

## 2017-01-02 DIAGNOSIS — G8929 Other chronic pain: Secondary | ICD-10-CM | POA: Diagnosis not present

## 2017-01-07 DIAGNOSIS — M5416 Radiculopathy, lumbar region: Secondary | ICD-10-CM | POA: Diagnosis not present

## 2017-01-07 DIAGNOSIS — M5441 Lumbago with sciatica, right side: Secondary | ICD-10-CM | POA: Diagnosis not present

## 2017-01-07 DIAGNOSIS — G8929 Other chronic pain: Secondary | ICD-10-CM | POA: Diagnosis not present

## 2017-01-09 DIAGNOSIS — G8929 Other chronic pain: Secondary | ICD-10-CM | POA: Diagnosis not present

## 2017-01-09 DIAGNOSIS — M5441 Lumbago with sciatica, right side: Secondary | ICD-10-CM | POA: Diagnosis not present

## 2017-01-09 DIAGNOSIS — M5416 Radiculopathy, lumbar region: Secondary | ICD-10-CM | POA: Diagnosis not present

## 2017-01-16 DIAGNOSIS — H359 Unspecified retinal disorder: Secondary | ICD-10-CM | POA: Diagnosis not present

## 2017-01-16 DIAGNOSIS — E113292 Type 2 diabetes mellitus with mild nonproliferative diabetic retinopathy without macular edema, left eye: Secondary | ICD-10-CM | POA: Diagnosis not present

## 2017-01-16 DIAGNOSIS — H34832 Tributary (branch) retinal vein occlusion, left eye, with macular edema: Secondary | ICD-10-CM | POA: Diagnosis not present

## 2017-01-16 DIAGNOSIS — H3581 Retinal edema: Secondary | ICD-10-CM | POA: Diagnosis not present

## 2017-01-17 DIAGNOSIS — G8929 Other chronic pain: Secondary | ICD-10-CM | POA: Diagnosis not present

## 2017-01-17 DIAGNOSIS — M5441 Lumbago with sciatica, right side: Secondary | ICD-10-CM | POA: Diagnosis not present

## 2017-01-28 ENCOUNTER — Ambulatory Visit: Payer: Medicare Other | Admitting: Sports Medicine

## 2017-02-11 DIAGNOSIS — E113292 Type 2 diabetes mellitus with mild nonproliferative diabetic retinopathy without macular edema, left eye: Secondary | ICD-10-CM | POA: Diagnosis not present

## 2017-02-11 DIAGNOSIS — H3581 Retinal edema: Secondary | ICD-10-CM | POA: Diagnosis not present

## 2017-02-11 DIAGNOSIS — H34832 Tributary (branch) retinal vein occlusion, left eye, with macular edema: Secondary | ICD-10-CM | POA: Diagnosis not present

## 2017-03-03 ENCOUNTER — Other Ambulatory Visit: Payer: Self-pay | Admitting: Internal Medicine

## 2017-03-03 DIAGNOSIS — Z1231 Encounter for screening mammogram for malignant neoplasm of breast: Secondary | ICD-10-CM

## 2017-03-20 DIAGNOSIS — H34832 Tributary (branch) retinal vein occlusion, left eye, with macular edema: Secondary | ICD-10-CM | POA: Diagnosis not present

## 2017-03-20 DIAGNOSIS — H3562 Retinal hemorrhage, left eye: Secondary | ICD-10-CM | POA: Diagnosis not present

## 2017-03-20 DIAGNOSIS — E113292 Type 2 diabetes mellitus with mild nonproliferative diabetic retinopathy without macular edema, left eye: Secondary | ICD-10-CM | POA: Diagnosis not present

## 2017-03-20 DIAGNOSIS — H3581 Retinal edema: Secondary | ICD-10-CM | POA: Diagnosis not present

## 2017-04-02 DIAGNOSIS — I10 Essential (primary) hypertension: Secondary | ICD-10-CM | POA: Diagnosis not present

## 2017-04-02 DIAGNOSIS — E119 Type 2 diabetes mellitus without complications: Secondary | ICD-10-CM | POA: Diagnosis not present

## 2017-04-07 ENCOUNTER — Ambulatory Visit
Admission: RE | Admit: 2017-04-07 | Discharge: 2017-04-07 | Disposition: A | Discharge status: Home / Self Care | Payer: Medicare Other | Source: Ambulatory Visit | Attending: Internal Medicine | Admitting: Internal Medicine

## 2017-04-07 DIAGNOSIS — Z1231 Encounter for screening mammogram for malignant neoplasm of breast: Secondary | ICD-10-CM | POA: Diagnosis not present

## 2017-04-24 DIAGNOSIS — Z23 Encounter for immunization: Secondary | ICD-10-CM | POA: Diagnosis not present

## 2017-05-01 DIAGNOSIS — E113292 Type 2 diabetes mellitus with mild nonproliferative diabetic retinopathy without macular edema, left eye: Secondary | ICD-10-CM | POA: Diagnosis not present

## 2017-05-01 DIAGNOSIS — H359 Unspecified retinal disorder: Secondary | ICD-10-CM | POA: Diagnosis not present

## 2017-05-01 DIAGNOSIS — H34832 Tributary (branch) retinal vein occlusion, left eye, with macular edema: Secondary | ICD-10-CM | POA: Diagnosis not present

## 2017-05-01 DIAGNOSIS — H3581 Retinal edema: Secondary | ICD-10-CM | POA: Diagnosis not present

## 2017-06-20 DIAGNOSIS — N390 Urinary tract infection, site not specified: Secondary | ICD-10-CM | POA: Diagnosis not present

## 2017-06-20 DIAGNOSIS — R358 Other polyuria: Secondary | ICD-10-CM | POA: Diagnosis not present

## 2017-07-30 DIAGNOSIS — H3581 Retinal edema: Secondary | ICD-10-CM | POA: Diagnosis not present

## 2017-07-30 DIAGNOSIS — H34832 Tributary (branch) retinal vein occlusion, left eye, with macular edema: Secondary | ICD-10-CM | POA: Diagnosis not present

## 2017-07-30 DIAGNOSIS — H359 Unspecified retinal disorder: Secondary | ICD-10-CM | POA: Diagnosis not present

## 2017-07-30 DIAGNOSIS — E113292 Type 2 diabetes mellitus with mild nonproliferative diabetic retinopathy without macular edema, left eye: Secondary | ICD-10-CM | POA: Diagnosis not present

## 2017-08-14 DIAGNOSIS — Z0000 Encounter for general adult medical examination without abnormal findings: Secondary | ICD-10-CM | POA: Diagnosis not present

## 2017-08-14 DIAGNOSIS — M8580 Other specified disorders of bone density and structure, unspecified site: Secondary | ICD-10-CM | POA: Diagnosis not present

## 2017-08-14 DIAGNOSIS — Z7982 Long term (current) use of aspirin: Secondary | ICD-10-CM | POA: Diagnosis not present

## 2017-08-14 DIAGNOSIS — N390 Urinary tract infection, site not specified: Secondary | ICD-10-CM | POA: Diagnosis not present

## 2017-08-14 DIAGNOSIS — E559 Vitamin D deficiency, unspecified: Secondary | ICD-10-CM | POA: Diagnosis not present

## 2017-08-14 DIAGNOSIS — E1165 Type 2 diabetes mellitus with hyperglycemia: Secondary | ICD-10-CM | POA: Diagnosis not present

## 2017-08-14 DIAGNOSIS — I10 Essential (primary) hypertension: Secondary | ICD-10-CM | POA: Diagnosis not present

## 2017-08-19 DIAGNOSIS — M8580 Other specified disorders of bone density and structure, unspecified site: Secondary | ICD-10-CM | POA: Diagnosis not present

## 2017-08-19 DIAGNOSIS — H02402 Unspecified ptosis of left eyelid: Secondary | ICD-10-CM | POA: Diagnosis not present

## 2017-08-19 DIAGNOSIS — H6121 Impacted cerumen, right ear: Secondary | ICD-10-CM | POA: Diagnosis not present

## 2017-08-19 DIAGNOSIS — Z8639 Personal history of other endocrine, nutritional and metabolic disease: Secondary | ICD-10-CM | POA: Diagnosis not present

## 2017-08-19 DIAGNOSIS — E042 Nontoxic multinodular goiter: Secondary | ICD-10-CM | POA: Diagnosis not present

## 2017-08-19 DIAGNOSIS — E119 Type 2 diabetes mellitus without complications: Secondary | ICD-10-CM | POA: Diagnosis not present

## 2017-08-19 DIAGNOSIS — I739 Peripheral vascular disease, unspecified: Secondary | ICD-10-CM | POA: Diagnosis not present

## 2017-08-19 DIAGNOSIS — Z8249 Family history of ischemic heart disease and other diseases of the circulatory system: Secondary | ICD-10-CM | POA: Diagnosis not present

## 2017-08-19 DIAGNOSIS — Z7982 Long term (current) use of aspirin: Secondary | ICD-10-CM | POA: Diagnosis not present

## 2017-08-19 DIAGNOSIS — I10 Essential (primary) hypertension: Secondary | ICD-10-CM | POA: Diagnosis not present

## 2017-08-19 DIAGNOSIS — R350 Frequency of micturition: Secondary | ICD-10-CM | POA: Diagnosis not present

## 2017-08-28 DIAGNOSIS — I739 Peripheral vascular disease, unspecified: Secondary | ICD-10-CM | POA: Diagnosis not present

## 2017-09-16 DIAGNOSIS — M8580 Other specified disorders of bone density and structure, unspecified site: Secondary | ICD-10-CM | POA: Diagnosis not present

## 2017-09-16 DIAGNOSIS — Z1212 Encounter for screening for malignant neoplasm of rectum: Secondary | ICD-10-CM | POA: Diagnosis not present

## 2017-09-16 DIAGNOSIS — Z01419 Encounter for gynecological examination (general) (routine) without abnormal findings: Secondary | ICD-10-CM | POA: Diagnosis not present

## 2017-09-16 DIAGNOSIS — Z780 Asymptomatic menopausal state: Secondary | ICD-10-CM | POA: Diagnosis not present

## 2017-09-23 DIAGNOSIS — E113292 Type 2 diabetes mellitus with mild nonproliferative diabetic retinopathy without macular edema, left eye: Secondary | ICD-10-CM | POA: Diagnosis not present

## 2017-09-23 DIAGNOSIS — H34832 Tributary (branch) retinal vein occlusion, left eye, with macular edema: Secondary | ICD-10-CM | POA: Diagnosis not present

## 2017-09-23 DIAGNOSIS — H43392 Other vitreous opacities, left eye: Secondary | ICD-10-CM | POA: Diagnosis not present

## 2017-09-23 DIAGNOSIS — H3581 Retinal edema: Secondary | ICD-10-CM | POA: Diagnosis not present

## 2018-01-20 DIAGNOSIS — M810 Age-related osteoporosis without current pathological fracture: Secondary | ICD-10-CM | POA: Diagnosis not present

## 2018-01-20 DIAGNOSIS — I10 Essential (primary) hypertension: Secondary | ICD-10-CM | POA: Diagnosis not present

## 2018-01-20 DIAGNOSIS — M8580 Other specified disorders of bone density and structure, unspecified site: Secondary | ICD-10-CM | POA: Diagnosis not present

## 2018-03-12 ENCOUNTER — Other Ambulatory Visit: Payer: Self-pay | Admitting: Internal Medicine

## 2018-03-12 DIAGNOSIS — Z1231 Encounter for screening mammogram for malignant neoplasm of breast: Secondary | ICD-10-CM

## 2018-03-25 DIAGNOSIS — H43812 Vitreous degeneration, left eye: Secondary | ICD-10-CM | POA: Diagnosis not present

## 2018-03-25 DIAGNOSIS — H348322 Tributary (branch) retinal vein occlusion, left eye, stable: Secondary | ICD-10-CM | POA: Diagnosis not present

## 2018-03-25 DIAGNOSIS — E113292 Type 2 diabetes mellitus with mild nonproliferative diabetic retinopathy without macular edema, left eye: Secondary | ICD-10-CM | POA: Diagnosis not present

## 2018-03-25 DIAGNOSIS — H3581 Retinal edema: Secondary | ICD-10-CM | POA: Diagnosis not present

## 2018-04-09 ENCOUNTER — Ambulatory Visit
Admission: RE | Admit: 2018-04-09 | Discharge: 2018-04-09 | Disposition: A | Discharge status: Home / Self Care | Payer: Medicare Other | Source: Ambulatory Visit | Attending: Internal Medicine | Admitting: Internal Medicine

## 2018-04-09 DIAGNOSIS — Z1231 Encounter for screening mammogram for malignant neoplasm of breast: Secondary | ICD-10-CM | POA: Diagnosis not present

## 2018-07-13 DIAGNOSIS — Z23 Encounter for immunization: Secondary | ICD-10-CM | POA: Diagnosis not present

## 2018-08-18 DIAGNOSIS — E1165 Type 2 diabetes mellitus with hyperglycemia: Secondary | ICD-10-CM | POA: Diagnosis not present

## 2018-08-18 DIAGNOSIS — M8580 Other specified disorders of bone density and structure, unspecified site: Secondary | ICD-10-CM | POA: Diagnosis not present

## 2018-08-18 DIAGNOSIS — N390 Urinary tract infection, site not specified: Secondary | ICD-10-CM | POA: Diagnosis not present

## 2018-08-18 DIAGNOSIS — I10 Essential (primary) hypertension: Secondary | ICD-10-CM | POA: Diagnosis not present

## 2018-08-25 DIAGNOSIS — I10 Essential (primary) hypertension: Secondary | ICD-10-CM | POA: Diagnosis not present

## 2018-08-25 DIAGNOSIS — Z7982 Long term (current) use of aspirin: Secondary | ICD-10-CM | POA: Diagnosis not present

## 2018-08-25 DIAGNOSIS — Z0000 Encounter for general adult medical examination without abnormal findings: Secondary | ICD-10-CM | POA: Diagnosis not present

## 2018-08-25 DIAGNOSIS — M8580 Other specified disorders of bone density and structure, unspecified site: Secondary | ICD-10-CM | POA: Diagnosis not present

## 2018-08-25 DIAGNOSIS — E118 Type 2 diabetes mellitus with unspecified complications: Secondary | ICD-10-CM | POA: Diagnosis not present

## 2018-09-22 DIAGNOSIS — Z1212 Encounter for screening for malignant neoplasm of rectum: Secondary | ICD-10-CM | POA: Diagnosis not present

## 2018-09-22 DIAGNOSIS — Z780 Asymptomatic menopausal state: Secondary | ICD-10-CM | POA: Diagnosis not present

## 2018-09-23 DIAGNOSIS — H26491 Other secondary cataract, right eye: Secondary | ICD-10-CM | POA: Diagnosis not present

## 2018-09-23 DIAGNOSIS — H43812 Vitreous degeneration, left eye: Secondary | ICD-10-CM | POA: Diagnosis not present

## 2018-09-23 DIAGNOSIS — E113292 Type 2 diabetes mellitus with mild nonproliferative diabetic retinopathy without macular edema, left eye: Secondary | ICD-10-CM | POA: Diagnosis not present

## 2018-09-23 DIAGNOSIS — H348322 Tributary (branch) retinal vein occlusion, left eye, stable: Secondary | ICD-10-CM | POA: Diagnosis not present

## 2018-09-23 DIAGNOSIS — H3562 Retinal hemorrhage, left eye: Secondary | ICD-10-CM | POA: Diagnosis not present

## 2018-12-15 DIAGNOSIS — M8580 Other specified disorders of bone density and structure, unspecified site: Secondary | ICD-10-CM | POA: Diagnosis not present

## 2018-12-15 DIAGNOSIS — E118 Type 2 diabetes mellitus with unspecified complications: Secondary | ICD-10-CM | POA: Diagnosis not present

## 2018-12-15 DIAGNOSIS — I10 Essential (primary) hypertension: Secondary | ICD-10-CM | POA: Diagnosis not present

## 2019-02-26 DIAGNOSIS — E119 Type 2 diabetes mellitus without complications: Secondary | ICD-10-CM | POA: Diagnosis not present

## 2019-02-26 DIAGNOSIS — Z961 Presence of intraocular lens: Secondary | ICD-10-CM | POA: Diagnosis not present

## 2019-02-26 DIAGNOSIS — H34832 Tributary (branch) retinal vein occlusion, left eye, with macular edema: Secondary | ICD-10-CM | POA: Diagnosis not present

## 2019-02-26 DIAGNOSIS — H40013 Open angle with borderline findings, low risk, bilateral: Secondary | ICD-10-CM | POA: Diagnosis not present

## 2019-02-26 DIAGNOSIS — H04123 Dry eye syndrome of bilateral lacrimal glands: Secondary | ICD-10-CM | POA: Diagnosis not present

## 2019-03-17 DIAGNOSIS — I10 Essential (primary) hypertension: Secondary | ICD-10-CM | POA: Diagnosis not present

## 2019-03-17 DIAGNOSIS — E1165 Type 2 diabetes mellitus with hyperglycemia: Secondary | ICD-10-CM | POA: Diagnosis not present

## 2019-03-17 DIAGNOSIS — E118 Type 2 diabetes mellitus with unspecified complications: Secondary | ICD-10-CM | POA: Diagnosis not present

## 2019-03-17 DIAGNOSIS — E7800 Pure hypercholesterolemia, unspecified: Secondary | ICD-10-CM | POA: Diagnosis not present

## 2019-03-23 ENCOUNTER — Other Ambulatory Visit: Payer: Self-pay | Admitting: Internal Medicine

## 2019-03-23 DIAGNOSIS — Z1231 Encounter for screening mammogram for malignant neoplasm of breast: Secondary | ICD-10-CM

## 2019-03-29 DIAGNOSIS — E113292 Type 2 diabetes mellitus with mild nonproliferative diabetic retinopathy without macular edema, left eye: Secondary | ICD-10-CM | POA: Diagnosis not present

## 2019-03-29 DIAGNOSIS — H3581 Retinal edema: Secondary | ICD-10-CM | POA: Diagnosis not present

## 2019-03-29 DIAGNOSIS — H43812 Vitreous degeneration, left eye: Secondary | ICD-10-CM | POA: Diagnosis not present

## 2019-03-29 DIAGNOSIS — H348322 Tributary (branch) retinal vein occlusion, left eye, stable: Secondary | ICD-10-CM | POA: Diagnosis not present

## 2019-05-10 ENCOUNTER — Ambulatory Visit
Admission: RE | Admit: 2019-05-10 | Discharge: 2019-05-10 | Disposition: A | Discharge status: Home / Self Care | Payer: Medicare Other | Source: Ambulatory Visit | Attending: Internal Medicine | Admitting: Internal Medicine

## 2019-05-10 ENCOUNTER — Other Ambulatory Visit: Payer: Self-pay

## 2019-05-10 DIAGNOSIS — Z1231 Encounter for screening mammogram for malignant neoplasm of breast: Secondary | ICD-10-CM

## 2019-05-14 DIAGNOSIS — Z23 Encounter for immunization: Secondary | ICD-10-CM | POA: Diagnosis not present

## 2019-08-31 DIAGNOSIS — Z7982 Long term (current) use of aspirin: Secondary | ICD-10-CM | POA: Diagnosis not present

## 2019-08-31 DIAGNOSIS — I10 Essential (primary) hypertension: Secondary | ICD-10-CM | POA: Diagnosis not present

## 2019-08-31 DIAGNOSIS — N390 Urinary tract infection, site not specified: Secondary | ICD-10-CM | POA: Diagnosis not present

## 2019-08-31 DIAGNOSIS — E1165 Type 2 diabetes mellitus with hyperglycemia: Secondary | ICD-10-CM | POA: Diagnosis not present

## 2019-09-04 ENCOUNTER — Ambulatory Visit: Payer: Medicare Other | Attending: Internal Medicine

## 2019-09-04 ENCOUNTER — Ambulatory Visit: Payer: Medicare Other

## 2019-09-04 DIAGNOSIS — Z23 Encounter for immunization: Secondary | ICD-10-CM | POA: Insufficient documentation

## 2019-09-04 NOTE — Progress Notes (Signed)
   Covid-19 Vaccination Clinic  Name:  Janika Jedlicka    MRN: [id number] DOB: 1930-02-28  09/04/2019  Ms. Lares was observed post Covid-19 immunization for 15 minutes without incidence. She was provided with Vaccine Information Sheet and instruction to access the V-Safe system.   Ms. Heinle was instructed to call 911 with any severe reactions post vaccine: Marland Kitchen Difficulty breathing  . Swelling of your face and throat  . A fast heartbeat  . A bad rash all over your body  . Dizziness and weakness    Immunizations Administered    Name Date Dose VIS Date Route   Pfizer COVID-19 Vaccine 09/04/2019  1:28 PM 0.3 mL 07/23/2019 Intramuscular   Manufacturer: [employer], [employer]   Lot: [id number]   NDC: [address]-0

## 2019-09-07 DIAGNOSIS — E7800 Pure hypercholesterolemia, unspecified: Secondary | ICD-10-CM | POA: Diagnosis not present

## 2019-09-07 DIAGNOSIS — E118 Type 2 diabetes mellitus with unspecified complications: Secondary | ICD-10-CM | POA: Diagnosis not present

## 2019-09-07 DIAGNOSIS — I10 Essential (primary) hypertension: Secondary | ICD-10-CM | POA: Diagnosis not present

## 2019-09-07 DIAGNOSIS — I739 Peripheral vascular disease, unspecified: Secondary | ICD-10-CM | POA: Diagnosis not present

## 2019-09-07 DIAGNOSIS — Z0001 Encounter for general adult medical examination with abnormal findings: Secondary | ICD-10-CM | POA: Diagnosis not present

## 2019-09-25 ENCOUNTER — Ambulatory Visit: Payer: Medicare Other | Attending: Internal Medicine

## 2019-09-25 DIAGNOSIS — Z23 Encounter for immunization: Secondary | ICD-10-CM | POA: Insufficient documentation

## 2019-09-25 NOTE — Progress Notes (Signed)
   Covid-19 Vaccination Clinic  Name:  Angel Forbes    MRN: [id number] DOB: July 03, 1930  09/25/2019  Angel Forbes was observed post Covid-19 immunization for 15 minutes without incidence. She was provided with Vaccine Information Sheet and instruction to access the V-Safe system.   Angel Forbes was instructed to call 911 with any severe reactions post vaccine: Marland Kitchen Difficulty breathing  . Swelling of your face and throat  . A fast heartbeat  . A bad rash all over your body  . Dizziness and weakness    Immunizations Administered    Name Date Dose VIS Date Route   Pfizer COVID-19 Vaccine 09/25/2019 11:26 AM 0.3 mL 07/23/2019 Intramuscular   Manufacturer: [employer], [employer]   Lot: [id number]   NDC: [address]-0

## 2019-09-27 DIAGNOSIS — H34832 Tributary (branch) retinal vein occlusion, left eye, with macular edema: Secondary | ICD-10-CM | POA: Diagnosis not present

## 2019-09-27 DIAGNOSIS — H43812 Vitreous degeneration, left eye: Secondary | ICD-10-CM | POA: Diagnosis not present

## 2019-09-27 DIAGNOSIS — H26491 Other secondary cataract, right eye: Secondary | ICD-10-CM | POA: Diagnosis not present

## 2019-09-27 DIAGNOSIS — E113292 Type 2 diabetes mellitus with mild nonproliferative diabetic retinopathy without macular edema, left eye: Secondary | ICD-10-CM | POA: Diagnosis not present

## 2019-10-05 DIAGNOSIS — Z1212 Encounter for screening for malignant neoplasm of rectum: Secondary | ICD-10-CM | POA: Diagnosis not present

## 2019-12-07 DIAGNOSIS — E118 Type 2 diabetes mellitus with unspecified complications: Secondary | ICD-10-CM | POA: Diagnosis not present

## 2020-01-25 ENCOUNTER — Ambulatory Visit (INDEPENDENT_AMBULATORY_CARE_PROVIDER_SITE_OTHER): Payer: Medicare Other | Admitting: Ophthalmology

## 2020-01-25 ENCOUNTER — Encounter (INDEPENDENT_AMBULATORY_CARE_PROVIDER_SITE_OTHER): Payer: Self-pay | Admitting: Ophthalmology

## 2020-01-25 ENCOUNTER — Other Ambulatory Visit: Payer: Self-pay

## 2020-01-25 DIAGNOSIS — H348322 Tributary (branch) retinal vein occlusion, left eye, stable: Secondary | ICD-10-CM

## 2020-01-25 DIAGNOSIS — H43812 Vitreous degeneration, left eye: Secondary | ICD-10-CM | POA: Insufficient documentation

## 2020-01-25 DIAGNOSIS — E113292 Type 2 diabetes mellitus with mild nonproliferative diabetic retinopathy without macular edema, left eye: Secondary | ICD-10-CM | POA: Diagnosis not present

## 2020-01-25 DIAGNOSIS — E113293 Type 2 diabetes mellitus with mild nonproliferative diabetic retinopathy without macular edema, bilateral: Secondary | ICD-10-CM | POA: Insufficient documentation

## 2020-01-25 DIAGNOSIS — H34832 Tributary (branch) retinal vein occlusion, left eye, with macular edema: Secondary | ICD-10-CM | POA: Diagnosis not present

## 2020-01-25 HISTORY — DX: Tributary (branch) retinal vein occlusion, left eye, stable: H34.8322

## 2020-01-25 HISTORY — DX: Vitreous degeneration, left eye: H43.812

## 2020-01-25 HISTORY — DX: Type 2 diabetes mellitus with mild nonproliferative diabetic retinopathy without macular edema, bilateral: E11.3293

## 2020-01-25 NOTE — Progress Notes (Signed)
01/25/2020     CHIEF COMPLAINT Patient presents for Retina Follow Up   HISTORY OF PRESENT ILLNESS: Angel Forbes is a 86 y.o. female who presents to the clinic today for:   HPI    Retina Follow Up    Patient presents with  CRVO/BRVO.  In both eyes.  Duration of 5 months.  Since onset it is stable.          Comments    4 month fu - OCT OU, FP OU,, history of CME OS in the past secondary to branch retinal vein occlusion, last treatment intravitreal was 2018 patient denies change in vision and overall has no complaints, except for OD being 'itchy'.  Itchiness is on the skin beneath the eye and does seem somewhat irritated.       Last edited by Hurman Horn, MD on 01/25/2020 11:37 AM. (History)      Referring physician: Deland Pretty, MD [address] [place],  [place] [address]  HISTORICAL INFORMATION:   Selected notes from the [place]: No current outpatient medications on file. (Ophthalmic Drugs)   No current facility-administered medications for this visit. (Ophthalmic Drugs)   Current Outpatient Medications (Other)  Medication Sig  . alendronate (FOSAMAX) 35 MG tablet   . cephALEXin (KEFLEX) 500 MG capsule   . ciprofloxacin (CIPRO) 250 MG tablet   . losartan-hydrochlorothiazide (HYZAAR) 100-25 MG tablet   . metFORMIN (GLUCOPHAGE) 500 MG tablet   . nitrofurantoin, macrocrystal-monohydrate, (MACROBID) 100 MG capsule    No current facility-administered medications for this visit. (Other)      REVIEW OF SYSTEMS:    ALLERGIES No Known Allergies  PAST MEDICAL HISTORY History reviewed. No pertinent past medical history. History reviewed. No pertinent surgical history.  FAMILY HISTORY History reviewed. No pertinent family history.  SOCIAL HISTORY Social History   Tobacco Use  . Smoking status: Never Smoker  . Smokeless tobacco: Never Used  Substance Use Topics  . Alcohol use: Not on file  .  Drug use: Not on file         OPHTHALMIC EXAM:  Base Eye Exam    Visual Acuity (Snellen - Linear)      Right Left   Dist [place] 20/30-2 20/70-1   Dist ph [place] 20/20-2 20/30       Tonometry (Tonopen, 10:44 AM)      Right Left   Pressure 14 12       Pupils      Pupils Dark Light Shape React APD   Right PERRL 4 3 Round Slow None   Left PERRL 4 3 Round Slow None       Visual Fields (Counting fingers)      Left Right    Full Full       Extraocular Movement      Right Left    Full Full       Neuro/Psych    Oriented x3: Yes   Mood/Affect: Normal       Dilation    Both eyes: 1.0% Mydriacyl, 2.5% Phenylephrine @ 10:44 AM        Slit Lamp and Fundus Exam    External Exam      Right Left   External Normal Normal       Slit Lamp Exam      Right Left   Lids/Lashes Normal Normal   Conjunctiva/Sclera White and quiet White and quiet  Cornea Clear Clear   Anterior Chamber Deep and quiet Deep and quiet   Iris Round and reactive Round and reactive   Lens Posterior chamber intraocular lens Posterior chamber intraocular lens   Anterior Vitreous Normal Normal       Fundus Exam      Right Left   Posterior Vitreous Posterior vitreous detachment Posterior vitreous detachment   Disc Peripapillary atrophy Peripapillary atrophy   C/D Ratio 0.25 0.45   Macula Normal Normal   Vessels NPDR- Mild NPDR- Mild   Periphery Normal Normal          IMAGING AND PROCEDURES  Imaging and Procedures for 01/25/20  OCT, Retina - OU - Both Eyes       Right Eye Quality was good. Scan locations included subfoveal. Central Foveal Thickness: 267. Progression has been stable. Findings include normal observations.   Left Eye Quality was good. Scan locations included subfoveal. Central Foveal Thickness: 264. Progression has been stable.   Notes OD normal,   OS with old intraretinal hyper reflective material, not active, no CME       Color Fundus Photography Optos - OU - Both Eyes        Right Eye Progression has been stable. Disc findings include normal observations. Macula : normal observations. Vessels : normal observations.   Left Eye Progression has been stable. Disc findings include normal observations. Macula : normal observations. Vessels : normal observations.   Notes OU with pericapillary atrophy, no active maculopathy.                ASSESSMENT/PLAN:  Nonproliferative diabetic retinopathy of left eye (HCC) The nature of mild nonproliferative diabetic retinopathy was discussed with the patient. Emphasis was placed on tight glucose, blood pressure, and serum lipid control. Avoidance of smoking was emphasized. Maintenance of normal body weight was emphasized. Appropriate follow up dilated exam is 1 year.  Branch retinal vein occlusion with macular edema of left eye This condition has resolved and has been untreated for 3 years, stable      ICD-10-CM   1. Branch retinal vein occlusion with macular edema of left eye  H34.8320 OCT, Retina - OU - Both Eyes    Color Fundus Photography Optos - OU - Both Eyes  2. Posterior vitreous detachment of left eye  H43.812   3. Nonproliferative diabetic retinopathy of left eye (HCC)  E11.3292 OCT, Retina - OU - Both Eyes    Color Fundus Photography Optos - OU - Both Eyes    1.  2.  3.  Ophthalmic Meds Ordered this visit:  No orders of the defined types were placed in this encounter.      Return in about 9 months (around 10/24/2020) for DILATE OU, OCT.  There are no Patient Instructions on file for this visit.   Explained the diagnoses, plan, and follow up with the patient and they expressed understanding.  Patient expressed understanding of the importance of proper follow up care.   Alford Highland Raywood Wailes M.D. Diseases & Surgery of the Retina and Vitreous [facility] 01/25/20     Abbreviations: M myopia (nearsighted); A astigmatism; H hyperopia (farsighted); P presbyopia; Mrx  spectacle prescription;  CTL contact lenses; OD right eye; OS left eye; OU both eyes  XT exotropia; ET esotropia; PEK punctate epithelial keratitis; PEE punctate epithelial erosions; DES dry eye syndrome; MGD meibomian gland dysfunction; ATs artificial tears; PFAT's preservative free artificial tears; NSC nuclear sclerotic cataract; PSC posterior subcapsular cataract; ERM epi-retinal  membrane; PVD posterior vitreous detachment; RD retinal detachment; DM diabetes mellitus; DR diabetic retinopathy; NPDR non-proliferative diabetic retinopathy; PDR proliferative diabetic retinopathy; CSME clinically significant macular edema; DME diabetic macular edema; dbh dot blot hemorrhages; CWS cotton wool spot; POAG primary open angle glaucoma; C/D cup-to-disc ratio; HVF humphrey visual field; GVF goldmann visual field; OCT optical coherence tomography; IOP intraocular pressure; BRVO Branch retinal vein occlusion; CRVO central retinal vein occlusion; CRAO central retinal artery occlusion; BRAO branch retinal artery occlusion; RT retinal tear; SB scleral buckle; PPV pars plana vitrectomy; VH Vitreous hemorrhage; PRP panretinal laser photocoagulation; IVK intravitreal kenalog; VMT vitreomacular traction; MH Macular hole;  NVD neovascularization of the disc; NVE neovascularization elsewhere; AREDS age related eye disease study; ARMD age related macular degeneration; POAG primary open angle glaucoma; EBMD epithelial/anterior basement membrane dystrophy; ACIOL anterior chamber intraocular lens; IOL intraocular lens; PCIOL posterior chamber intraocular lens; Phaco/IOL phacoemulsification with intraocular lens placement; [place] photorefractive keratectomy; LASIK laser assisted in situ keratomileusis; HTN hypertension; DM diabetes mellitus; COPD chronic obstructive pulmonary disease

## 2020-01-25 NOTE — Assessment & Plan Note (Signed)
The nature of mild nonproliferative diabetic retinopathy was discussed with the patient. Emphasis was placed on tight glucose, blood pressure, and serum lipid control. Avoidance of smoking was emphasized. Maintenance of normal body weight was emphasized. Appropriate follow up dilated exam is 1 year. 

## 2020-01-25 NOTE — Assessment & Plan Note (Signed)
This condition has resolved and has been untreated for 3 years, stable

## 2020-02-08 DIAGNOSIS — I10 Essential (primary) hypertension: Secondary | ICD-10-CM | POA: Diagnosis not present

## 2020-02-08 DIAGNOSIS — E118 Type 2 diabetes mellitus with unspecified complications: Secondary | ICD-10-CM | POA: Diagnosis not present

## 2020-02-08 DIAGNOSIS — E559 Vitamin D deficiency, unspecified: Secondary | ICD-10-CM | POA: Diagnosis not present

## 2020-02-08 DIAGNOSIS — E7800 Pure hypercholesterolemia, unspecified: Secondary | ICD-10-CM | POA: Diagnosis not present

## 2020-02-08 DIAGNOSIS — Z8639 Personal history of other endocrine, nutritional and metabolic disease: Secondary | ICD-10-CM | POA: Diagnosis not present

## 2020-02-08 DIAGNOSIS — M810 Age-related osteoporosis without current pathological fracture: Secondary | ICD-10-CM | POA: Diagnosis not present

## 2020-04-05 ENCOUNTER — Other Ambulatory Visit: Payer: Self-pay | Admitting: Internal Medicine

## 2020-04-05 DIAGNOSIS — Z1231 Encounter for screening mammogram for malignant neoplasm of breast: Secondary | ICD-10-CM

## 2020-05-10 ENCOUNTER — Ambulatory Visit: Payer: Medicare Other

## 2020-05-22 ENCOUNTER — Ambulatory Visit
Admission: RE | Admit: 2020-05-22 | Discharge: 2020-05-22 | Disposition: A | Discharge status: Home / Self Care | Payer: Medicare Other | Source: Ambulatory Visit | Attending: Internal Medicine | Admitting: Internal Medicine

## 2020-05-22 ENCOUNTER — Other Ambulatory Visit: Payer: Self-pay

## 2020-05-22 DIAGNOSIS — Z1231 Encounter for screening mammogram for malignant neoplasm of breast: Secondary | ICD-10-CM

## 2020-09-05 DIAGNOSIS — I10 Essential (primary) hypertension: Secondary | ICD-10-CM | POA: Diagnosis not present

## 2020-09-05 DIAGNOSIS — E1165 Type 2 diabetes mellitus with hyperglycemia: Secondary | ICD-10-CM | POA: Diagnosis not present

## 2020-09-05 DIAGNOSIS — N390 Urinary tract infection, site not specified: Secondary | ICD-10-CM | POA: Diagnosis not present

## 2020-09-12 DIAGNOSIS — E118 Type 2 diabetes mellitus with unspecified complications: Secondary | ICD-10-CM | POA: Diagnosis not present

## 2020-09-12 DIAGNOSIS — R413 Other amnesia: Secondary | ICD-10-CM | POA: Diagnosis not present

## 2020-09-12 DIAGNOSIS — I10 Essential (primary) hypertension: Secondary | ICD-10-CM | POA: Diagnosis not present

## 2020-09-12 DIAGNOSIS — R350 Frequency of micturition: Secondary | ICD-10-CM | POA: Diagnosis not present

## 2020-09-12 DIAGNOSIS — I739 Peripheral vascular disease, unspecified: Secondary | ICD-10-CM | POA: Diagnosis not present

## 2020-09-12 DIAGNOSIS — H9192 Unspecified hearing loss, left ear: Secondary | ICD-10-CM | POA: Diagnosis not present

## 2020-09-12 DIAGNOSIS — E7800 Pure hypercholesterolemia, unspecified: Secondary | ICD-10-CM | POA: Diagnosis not present

## 2020-09-12 DIAGNOSIS — M810 Age-related osteoporosis without current pathological fracture: Secondary | ICD-10-CM | POA: Diagnosis not present

## 2020-09-12 DIAGNOSIS — D649 Anemia, unspecified: Secondary | ICD-10-CM | POA: Diagnosis not present

## 2020-09-12 DIAGNOSIS — Z0000 Encounter for general adult medical examination without abnormal findings: Secondary | ICD-10-CM | POA: Diagnosis not present

## 2020-09-13 ENCOUNTER — Other Ambulatory Visit: Payer: Self-pay | Admitting: Internal Medicine

## 2020-09-13 DIAGNOSIS — R413 Other amnesia: Secondary | ICD-10-CM

## 2020-09-19 DIAGNOSIS — Z1212 Encounter for screening for malignant neoplasm of rectum: Secondary | ICD-10-CM | POA: Diagnosis not present

## 2020-10-05 DIAGNOSIS — I10 Essential (primary) hypertension: Secondary | ICD-10-CM | POA: Diagnosis not present

## 2020-10-24 ENCOUNTER — Ambulatory Visit (INDEPENDENT_AMBULATORY_CARE_PROVIDER_SITE_OTHER): Payer: Medicare Other | Admitting: Ophthalmology

## 2020-10-24 ENCOUNTER — Other Ambulatory Visit: Payer: Self-pay

## 2020-10-24 ENCOUNTER — Encounter (INDEPENDENT_AMBULATORY_CARE_PROVIDER_SITE_OTHER): Payer: Self-pay | Admitting: Ophthalmology

## 2020-10-24 DIAGNOSIS — I739 Peripheral vascular disease, unspecified: Secondary | ICD-10-CM | POA: Diagnosis not present

## 2020-10-24 DIAGNOSIS — E113293 Type 2 diabetes mellitus with mild nonproliferative diabetic retinopathy without macular edema, bilateral: Secondary | ICD-10-CM | POA: Diagnosis not present

## 2020-10-24 DIAGNOSIS — H34832 Tributary (branch) retinal vein occlusion, left eye, with macular edema: Secondary | ICD-10-CM

## 2020-10-24 NOTE — Assessment & Plan Note (Signed)
No signs of active CME and stable acuity OS

## 2020-10-24 NOTE — Progress Notes (Signed)
10/24/2020     CHIEF COMPLAINT Patient presents for Retina Follow Up (9 Mo F/U OU//Pt denies changes to [place] OU since last visit x 9 mo ago. Pt c/o "stye" OD.)   HISTORY OF PRESENT ILLNESS: Angel Forbes is a 87 y.o. female who presents to the clinic today for:   HPI    Retina Follow Up    Patient presents with  CRVO/BRVO.  In left eye.  This started 9 months ago.  Severity is mild.  Duration of 9 months.  Since onset it is stable. Additional comments: 9 Mo F/U OU  Pt denies changes to [place] OU since last visit x 9 mo ago. Pt c/o "stye" OD.       Last edited by Ileana Roup, COA on 10/24/2020 10:30 AM. (History)      Referring physician: Merri Brunette, MD [address] [place],  [place] [address]  HISTORICAL INFORMATION:   Selected notes from the MEDICAL RECORD NUMBER       CURRENT MEDICATIONS: No current outpatient medications on file. (Ophthalmic Drugs)   No current facility-administered medications for this visit. (Ophthalmic Drugs)   Current Outpatient Medications (Other)  Medication Sig  . alendronate (FOSAMAX) 35 MG tablet   . cephALEXin (KEFLEX) 500 MG capsule   . ciprofloxacin (CIPRO) 250 MG tablet   . losartan-hydrochlorothiazide (HYZAAR) 100-25 MG tablet   . metFORMIN (GLUCOPHAGE) 500 MG tablet   . nitrofurantoin, macrocrystal-monohydrate, (MACROBID) 100 MG capsule    No current facility-administered medications for this visit. (Other)      REVIEW OF SYSTEMS:    ALLERGIES No Known Allergies  PAST MEDICAL HISTORY History reviewed. No pertinent past medical history. History reviewed. No pertinent surgical history.  FAMILY HISTORY History reviewed. No pertinent family history.  SOCIAL HISTORY Social History   Tobacco Use  . Smoking status: Never Smoker  . Smokeless tobacco: Never Used         OPHTHALMIC EXAM: Base Eye Exam    Visual Acuity (ETDRS)      Right Left   Dist [place] 20/40 20/70 -3   Dist ph [place] 20/25 +2 20/50 +2        Tonometry (Tonopen, 10:30 AM)      Right Left   Pressure 16 13       Pupils      Pupils Dark Light Shape React APD   Right PERRL 4 3 Round Slow None   Left PERRL 4 3 Round Slow None       Visual Fields (Counting fingers)      Left Right    Full Full       Extraocular Movement      Right Left    Full Full       Neuro/Psych    Oriented x3: Yes   Mood/Affect: Normal       Dilation    Both eyes: 1.0% Mydriacyl, 2.5% Phenylephrine @ 10:35 AM        Slit Lamp and Fundus Exam    External Exam      Right Left   External Normal Normal       Slit Lamp Exam      Right Left   Lids/Lashes Normal Normal   Conjunctiva/Sclera White and quiet White and quiet   Cornea Clear Clear   Anterior Chamber Deep and quiet Deep and quiet   Iris Round and reactive Round and reactive   Lens Posterior chamber intraocular lens Posterior chamber intraocular lens  Anterior Vitreous Normal Normal       Fundus Exam      Right Left   Posterior Vitreous Posterior vitreous detachment Posterior vitreous detachment   Disc Peripapillary atrophy Peripapillary atrophy   C/D Ratio 0.25 0.45   Macula Normal Normal   Vessels NPDR- Mild NPDR- Mild   Periphery Normal Normal          IMAGING AND PROCEDURES  Imaging and Procedures for 10/24/20  OCT, Retina - OU - Both Eyes       Right Eye Quality was good. Scan locations included subfoveal. Central Foveal Thickness: 265. Progression has been stable. Findings include normal observations.   Left Eye Quality was good. Scan locations included subfoveal. Central Foveal Thickness: 271. Progression has been stable. Findings include intraretinal hyper-reflective material.   Notes OD normal,   OS with old intraretinal hyper reflective material, not active,   no active CME                ASSESSMENT/PLAN:  Branch retinal vein occlusion with macular edema of left eye No signs of active CME and stable acuity OS  Mild  nonproliferative diabetic retinopathy of both eyes (HCC) Mild and stable will observe      ICD-10-CM   1. Branch retinal vein occlusion with macular edema of left eye  H34.8320 OCT, Retina - OU - Both Eyes  2. Mild nonproliferative diabetic retinopathy of both eyes without macular edema associated with type 2 diabetes mellitus (HCC)  L38.1017     1.  Follow-up Dr. Elmer Picker and Dr. Baker Pierini regarding recent hordeolum and its treatment anteriorly right eye, no active disease currently OD  2.  History of BRVO and its appropriate therapy for CME OS in the past, and active now will observe  3.  Ophthalmic Meds Ordered this visit:  No orders of the defined types were placed in this encounter.      Return in about 9 months (around 07/26/2021) for DILATE OU, OCT, COLOR FP.  There are no Patient Instructions on file for this visit.   Explained the diagnoses, plan, and follow up with the patient and they expressed understanding.  Patient expressed understanding of the importance of proper follow up care.   Alford Highland [name] M.D. Diseases & Surgery of the Retina and Vitreous [facility] 10/24/20     Abbreviations: M myopia (nearsighted); A astigmatism; H hyperopia (farsighted); P presbyopia; Mrx spectacle prescription;  CTL contact lenses; OD right eye; OS left eye; OU both eyes  XT exotropia; ET esotropia; PEK punctate epithelial keratitis; PEE punctate epithelial erosions; DES dry eye syndrome; MGD meibomian gland dysfunction; ATs artificial tears; PFAT's preservative free artificial tears; NSC nuclear sclerotic cataract; PSC posterior subcapsular cataract; ERM epi-retinal membrane; PVD posterior vitreous detachment; RD retinal detachment; DM diabetes mellitus; DR diabetic retinopathy; NPDR non-proliferative diabetic retinopathy; PDR proliferative diabetic retinopathy; CSME clinically significant macular edema; DME diabetic macular edema; dbh dot blot hemorrhages; CWS  cotton wool spot; POAG primary open angle glaucoma; C/D cup-to-disc ratio; HVF humphrey visual field; GVF goldmann visual field; OCT optical coherence tomography; IOP intraocular pressure; BRVO Branch retinal vein occlusion; CRVO central retinal vein occlusion; CRAO central retinal artery occlusion; BRAO branch retinal artery occlusion; RT retinal tear; SB scleral buckle; PPV pars plana vitrectomy; VH Vitreous hemorrhage; PRP panretinal laser photocoagulation; IVK intravitreal kenalog; VMT vitreomacular traction; MH Macular hole;  NVD neovascularization of the disc; NVE neovascularization elsewhere; AREDS age related eye disease study; ARMD age related macular  degeneration; POAG primary open angle glaucoma; EBMD epithelial/anterior basement membrane dystrophy; ACIOL anterior chamber intraocular lens; IOL intraocular lens; PCIOL posterior chamber intraocular lens; Phaco/IOL phacoemulsification with intraocular lens placement; [place] photorefractive keratectomy; LASIK laser assisted in situ keratomileusis; HTN hypertension; DM diabetes mellitus; COPD chronic obstructive pulmonary disease

## 2020-10-24 NOTE — Assessment & Plan Note (Signed)
Mild and stable will observe

## 2020-10-25 ENCOUNTER — Ambulatory Visit
Admission: RE | Admit: 2020-10-25 | Discharge: 2020-10-25 | Disposition: A | Discharge status: Home / Self Care | Payer: Medicare Other | Source: Ambulatory Visit | Attending: Internal Medicine | Admitting: Internal Medicine

## 2020-10-25 DIAGNOSIS — I6782 Cerebral ischemia: Secondary | ICD-10-CM | POA: Diagnosis not present

## 2020-10-25 DIAGNOSIS — J3489 Other specified disorders of nose and nasal sinuses: Secondary | ICD-10-CM | POA: Diagnosis not present

## 2020-10-25 DIAGNOSIS — G9389 Other specified disorders of brain: Secondary | ICD-10-CM | POA: Diagnosis not present

## 2020-10-25 DIAGNOSIS — R413 Other amnesia: Secondary | ICD-10-CM | POA: Diagnosis not present

## 2020-11-01 DIAGNOSIS — H93293 Other abnormal auditory perceptions, bilateral: Secondary | ICD-10-CM | POA: Diagnosis not present

## 2020-11-01 DIAGNOSIS — H6123 Impacted cerumen, bilateral: Secondary | ICD-10-CM | POA: Diagnosis not present

## 2020-11-02 DIAGNOSIS — H903 Sensorineural hearing loss, bilateral: Secondary | ICD-10-CM | POA: Diagnosis not present

## 2020-11-03 DIAGNOSIS — H40013 Open angle with borderline findings, low risk, bilateral: Secondary | ICD-10-CM | POA: Diagnosis not present

## 2020-11-03 DIAGNOSIS — H02032 Senile entropion of right lower eyelid: Secondary | ICD-10-CM | POA: Diagnosis not present

## 2020-11-03 DIAGNOSIS — H04123 Dry eye syndrome of bilateral lacrimal glands: Secondary | ICD-10-CM | POA: Diagnosis not present

## 2020-11-03 DIAGNOSIS — E119 Type 2 diabetes mellitus without complications: Secondary | ICD-10-CM | POA: Diagnosis not present

## 2020-11-03 DIAGNOSIS — H524 Presbyopia: Secondary | ICD-10-CM | POA: Diagnosis not present

## 2020-12-07 DIAGNOSIS — H16211 Exposure keratoconjunctivitis, right eye: Secondary | ICD-10-CM | POA: Diagnosis not present

## 2020-12-07 DIAGNOSIS — H02532 Eyelid retraction right lower eyelid: Secondary | ICD-10-CM | POA: Diagnosis not present

## 2020-12-07 DIAGNOSIS — H02032 Senile entropion of right lower eyelid: Secondary | ICD-10-CM | POA: Diagnosis not present

## 2021-01-17 DIAGNOSIS — H16201 Unspecified keratoconjunctivitis, right eye: Secondary | ICD-10-CM | POA: Diagnosis not present

## 2021-01-17 DIAGNOSIS — H02032 Senile entropion of right lower eyelid: Secondary | ICD-10-CM | POA: Diagnosis not present

## 2021-01-17 DIAGNOSIS — H02532 Eyelid retraction right lower eyelid: Secondary | ICD-10-CM | POA: Diagnosis not present

## 2021-01-17 DIAGNOSIS — H16211 Exposure keratoconjunctivitis, right eye: Secondary | ICD-10-CM | POA: Diagnosis not present

## 2021-01-17 DIAGNOSIS — H02012 Cicatricial entropion of right lower eyelid: Secondary | ICD-10-CM | POA: Diagnosis not present

## 2021-02-27 DIAGNOSIS — H524 Presbyopia: Secondary | ICD-10-CM | POA: Diagnosis not present

## 2021-02-27 DIAGNOSIS — H04123 Dry eye syndrome of bilateral lacrimal glands: Secondary | ICD-10-CM | POA: Diagnosis not present

## 2021-03-15 DIAGNOSIS — I10 Essential (primary) hypertension: Secondary | ICD-10-CM | POA: Diagnosis not present

## 2021-03-15 DIAGNOSIS — E118 Type 2 diabetes mellitus with unspecified complications: Secondary | ICD-10-CM | POA: Diagnosis not present

## 2021-04-11 DIAGNOSIS — E118 Type 2 diabetes mellitus with unspecified complications: Secondary | ICD-10-CM | POA: Diagnosis not present

## 2021-04-11 DIAGNOSIS — E7800 Pure hypercholesterolemia, unspecified: Secondary | ICD-10-CM | POA: Diagnosis not present

## 2021-04-11 DIAGNOSIS — I10 Essential (primary) hypertension: Secondary | ICD-10-CM | POA: Diagnosis not present

## 2021-07-12 ENCOUNTER — Other Ambulatory Visit: Payer: Self-pay | Admitting: Internal Medicine

## 2021-07-12 DIAGNOSIS — E7800 Pure hypercholesterolemia, unspecified: Secondary | ICD-10-CM | POA: Diagnosis not present

## 2021-07-12 DIAGNOSIS — E118 Type 2 diabetes mellitus with unspecified complications: Secondary | ICD-10-CM | POA: Diagnosis not present

## 2021-07-12 DIAGNOSIS — R350 Frequency of micturition: Secondary | ICD-10-CM | POA: Diagnosis not present

## 2021-07-12 DIAGNOSIS — Z1231 Encounter for screening mammogram for malignant neoplasm of breast: Secondary | ICD-10-CM

## 2021-07-12 DIAGNOSIS — I10 Essential (primary) hypertension: Secondary | ICD-10-CM | POA: Diagnosis not present

## 2021-07-31 ENCOUNTER — Other Ambulatory Visit: Payer: Self-pay

## 2021-07-31 ENCOUNTER — Encounter (INDEPENDENT_AMBULATORY_CARE_PROVIDER_SITE_OTHER): Payer: Self-pay | Admitting: Ophthalmology

## 2021-07-31 ENCOUNTER — Ambulatory Visit (INDEPENDENT_AMBULATORY_CARE_PROVIDER_SITE_OTHER): Payer: Medicare Other | Admitting: Ophthalmology

## 2021-07-31 DIAGNOSIS — H353131 Nonexudative age-related macular degeneration, bilateral, early dry stage: Secondary | ICD-10-CM | POA: Insufficient documentation

## 2021-07-31 DIAGNOSIS — H34832 Tributary (branch) retinal vein occlusion, left eye, with macular edema: Secondary | ICD-10-CM | POA: Diagnosis not present

## 2021-07-31 DIAGNOSIS — H348322 Tributary (branch) retinal vein occlusion, left eye, stable: Secondary | ICD-10-CM

## 2021-07-31 DIAGNOSIS — H43812 Vitreous degeneration, left eye: Secondary | ICD-10-CM | POA: Diagnosis not present

## 2021-07-31 HISTORY — DX: Nonexudative age-related macular degeneration, bilateral, early dry stage: H35.3131

## 2021-07-31 NOTE — Assessment & Plan Note (Signed)
Minor, located only peripapillary and under the p.m. bundle, stable

## 2021-07-31 NOTE — Assessment & Plan Note (Signed)
Physiologic stable 

## 2021-07-31 NOTE — Assessment & Plan Note (Signed)
History of CME in the past now in active

## 2021-07-31 NOTE — Progress Notes (Signed)
07/31/2021     CHIEF COMPLAINT Patient presents for  Chief Complaint  Patient presents with   Retina Follow Up    9 Mo F/U OU  Pt denies changes to [facility] since last visit x 9 mo ago. Pt c/o "stye" OD.      HISTORY OF PRESENT ILLNESS: Angel Forbes is a 87 y.o. female who presents to the clinic today for:   HPI     Retina Follow Up           Diagnosis: CRVO/BRVO   Laterality: left eye   Onset: 9 months ago   Severity: mild   Duration: 9 months   Course: stable   Comments: 9 Mo F/U OU  Pt denies changes to [place] OU since last visit x 9 mo ago. Pt c/o "stye" OD.         Comments   9 mos fu OU oct fp. Patient states vision is stable and unchanged since last visit. Denies any new floaters or FOL. Pt is not using any prescription eye drops.      Last edited by Nelva Nay on 07/31/2021 10:43 AM.      Referring physician: Merri Brunette, MD [address] [place],  [place] [address]  HISTORICAL INFORMATION:   Selected notes from the MEDICAL RECORD NUMBER       CURRENT MEDICATIONS: No current outpatient medications on file. (Ophthalmic Drugs)   No current facility-administered medications for this visit. (Ophthalmic Drugs)   Current Outpatient Medications (Other)  Medication Sig   alendronate (FOSAMAX) 35 MG tablet    cephALEXin (KEFLEX) 500 MG capsule    ciprofloxacin (CIPRO) 250 MG tablet    losartan-hydrochlorothiazide (HYZAAR) 100-25 MG tablet    metFORMIN (GLUCOPHAGE) 500 MG tablet    nitrofurantoin, macrocrystal-monohydrate, (MACROBID) 100 MG capsule    No current facility-administered medications for this visit. (Other)      REVIEW OF SYSTEMS:    ALLERGIES No Known Allergies  PAST MEDICAL HISTORY History reviewed. No pertinent past medical history. History reviewed. No pertinent surgical history.  FAMILY HISTORY History reviewed. No pertinent family history.  SOCIAL HISTORY Social History   Tobacco Use    Smoking status: Never   Smokeless tobacco: Never         OPHTHALMIC EXAM:  Base Eye Exam     Visual Acuity (ETDRS)       Right Left   Dist [place] 20/50 -2 20/80   Dist ph [place] 20/25 +1 20/40         Tonometry (Tonopen, 10:47 AM)       Right Left   Pressure 17 14         Pupils       Pupils Dark Light APD   Right PERRL 4 3 None   Left PERRL 4 3 None         Visual Fields (Counting fingers)       Left Right    Full Full         Extraocular Movement       Right Left    Full Full         Neuro/Psych     Oriented x3: Yes   Mood/Affect: Normal         Dilation     Both eyes: 1.0% Mydriacyl, 2.5% Phenylephrine @ 10:47 AM           Slit Lamp and Fundus Exam     External Exam  Right Left   External Normal Normal         Slit Lamp Exam       Right Left   Lids/Lashes Normal Normal   Conjunctiva/Sclera White and quiet White and quiet   Cornea Clear Clear   Anterior Chamber Deep and quiet Deep and quiet   Iris Round and reactive Round and reactive   Lens Posterior chamber intraocular lens Posterior chamber intraocular lens   Anterior Vitreous Normal Normal         Fundus Exam       Right Left   Posterior Vitreous Posterior vitreous detachment Posterior vitreous detachment   Disc Peripapillary atrophy Peripapillary atrophy   C/D Ratio 0.4 0.45   Macula Normal Microaneurysms, no macular thickening, no cystoid macular edema   Vessels NPDR- Mild NPDR- Mild   Periphery Normal Normal            IMAGING AND PROCEDURES  Imaging and Procedures for 07/31/21  Color Fundus Photography Optos - OU - Both Eyes       Right Eye Progression has been stable. Disc findings include normal observations. Macula : normal observations. Vessels : normal observations.   Left Eye Progression has been stable. Disc findings include normal observations. Macula : normal observations. Vessels : normal observations.   Notes OU with  peripapillary atrophy, no active maculopathy.     OCT, Retina - OU - Both Eyes       Right Eye Quality was good. Scan locations included subfoveal. Central Foveal Thickness: 265. Progression has been stable. Findings include normal observations.   Left Eye Quality was good. Scan locations included subfoveal. Central Foveal Thickness: 263. Progression has been stable. Findings include intraretinal hyper-reflective material.   Notes OD normal,   OS with old intraretinal hyper reflective material, not active,   no active CME             ASSESSMENT/PLAN:  Stable branch retinal vein occlusion of left eye History of CME in the past now in active  Posterior vitreous detachment of left eye Physiologic stable  Nonexudative age-related macular degeneration, bilateral, early dry stage Minor, located only peripapillary and under the p.m. bundle, stable     ICD-10-CM   1. Branch retinal vein occlusion with macular edema of left eye  H34.8320 Color Fundus Photography Optos - OU - Both Eyes    OCT, Retina - OU - Both Eyes    2. Stable branch retinal vein occlusion of left eye  H34.8322     3. Posterior vitreous detachment of left eye  H43.812     4. Nonexudative age-related macular degeneration, bilateral, early dry stage  H35.3131       1.  History of treated CME from BRVO OS now stabilized, residual microaneurysms are seen on the macular region but with no impact on acuity.  2.  Minor dry AMD in the p.m. bundle mostly however peripapillary risking progression of glaucoma but no significant impact on foveal vision at this time.  3.  Follow-up with [facility], Dr. Zenaida Niece as scheduled in March 2023  Ophthalmic Meds Ordered this visit:  No orders of the defined types were placed in this encounter.      Return in about 1 year (around 07/31/2022) for DILATE OU, COLOR FP, OCT.  There are no Patient Instructions on file for this visit.   Explained the diagnoses, plan,  and follow up with the patient and they expressed understanding.  Patient expressed understanding of the importance  of proper follow up care.   Alford Highland Arla Boutwell M.D. Diseases & Surgery of the Retina and Vitreous [facility] 07/31/21     Abbreviations: M myopia (nearsighted); A astigmatism; H hyperopia (farsighted); P presbyopia; Mrx spectacle prescription;  CTL contact lenses; OD right eye; OS left eye; OU both eyes  XT exotropia; ET esotropia; PEK punctate epithelial keratitis; PEE punctate epithelial erosions; DES dry eye syndrome; MGD meibomian gland dysfunction; ATs artificial tears; PFAT's preservative free artificial tears; NSC nuclear sclerotic cataract; PSC posterior subcapsular cataract; ERM epi-retinal membrane; PVD posterior vitreous detachment; RD retinal detachment; DM diabetes mellitus; DR diabetic retinopathy; NPDR non-proliferative diabetic retinopathy; PDR proliferative diabetic retinopathy; CSME clinically significant macular edema; DME diabetic macular edema; dbh dot blot hemorrhages; CWS cotton wool spot; POAG primary open angle glaucoma; C/D cup-to-disc ratio; HVF humphrey visual field; GVF goldmann visual field; OCT optical coherence tomography; IOP intraocular pressure; BRVO Branch retinal vein occlusion; CRVO central retinal vein occlusion; CRAO central retinal artery occlusion; BRAO branch retinal artery occlusion; RT retinal tear; SB scleral buckle; PPV pars plana vitrectomy; VH Vitreous hemorrhage; PRP panretinal laser photocoagulation; IVK intravitreal kenalog; VMT vitreomacular traction; MH Macular hole;  NVD neovascularization of the disc; NVE neovascularization elsewhere; AREDS age related eye disease study; ARMD age related macular degeneration; POAG primary open angle glaucoma; EBMD epithelial/anterior basement membrane dystrophy; ACIOL anterior chamber intraocular lens; IOL intraocular lens; PCIOL posterior chamber intraocular lens; Phaco/IOL  phacoemulsification with intraocular lens placement; PRK photorefractive keratectomy; LASIK laser assisted in situ keratomileusis; HTN hypertension; DM diabetes mellitus; COPD chronic obstructive pulmonary disease

## 2021-08-16 ENCOUNTER — Ambulatory Visit
Admission: RE | Admit: 2021-08-16 | Discharge: 2021-08-16 | Disposition: A | Discharge status: Home / Self Care | Payer: Medicare Other | Source: Ambulatory Visit | Attending: Internal Medicine | Admitting: Internal Medicine

## 2021-08-16 DIAGNOSIS — Z1231 Encounter for screening mammogram for malignant neoplasm of breast: Secondary | ICD-10-CM | POA: Diagnosis not present

## 2021-08-17 ENCOUNTER — Other Ambulatory Visit: Payer: Self-pay | Admitting: Internal Medicine

## 2021-08-17 DIAGNOSIS — R928 Other abnormal and inconclusive findings on diagnostic imaging of breast: Secondary | ICD-10-CM

## 2021-09-13 DIAGNOSIS — I10 Essential (primary) hypertension: Secondary | ICD-10-CM | POA: Diagnosis not present

## 2021-09-13 DIAGNOSIS — E1165 Type 2 diabetes mellitus with hyperglycemia: Secondary | ICD-10-CM | POA: Diagnosis not present

## 2021-09-17 DIAGNOSIS — I10 Essential (primary) hypertension: Secondary | ICD-10-CM | POA: Diagnosis not present

## 2021-09-17 DIAGNOSIS — E7800 Pure hypercholesterolemia, unspecified: Secondary | ICD-10-CM | POA: Diagnosis not present

## 2021-09-17 DIAGNOSIS — M8580 Other specified disorders of bone density and structure, unspecified site: Secondary | ICD-10-CM | POA: Diagnosis not present

## 2021-09-17 DIAGNOSIS — Z0000 Encounter for general adult medical examination without abnormal findings: Secondary | ICD-10-CM | POA: Diagnosis not present

## 2021-09-17 DIAGNOSIS — E118 Type 2 diabetes mellitus with unspecified complications: Secondary | ICD-10-CM | POA: Diagnosis not present

## 2021-09-26 ENCOUNTER — Ambulatory Visit
Admission: RE | Admit: 2021-09-26 | Discharge: 2021-09-26 | Disposition: A | Discharge status: Home / Self Care | Payer: Medicare Other | Source: Ambulatory Visit | Attending: Internal Medicine | Admitting: Internal Medicine

## 2021-09-26 ENCOUNTER — Other Ambulatory Visit: Payer: Self-pay

## 2021-09-26 DIAGNOSIS — R928 Other abnormal and inconclusive findings on diagnostic imaging of breast: Secondary | ICD-10-CM

## 2021-09-26 DIAGNOSIS — R922 Inconclusive mammogram: Secondary | ICD-10-CM | POA: Diagnosis not present

## 2021-10-10 DIAGNOSIS — Z1212 Encounter for screening for malignant neoplasm of rectum: Secondary | ICD-10-CM | POA: Diagnosis not present

## 2021-10-15 DIAGNOSIS — H26492 Other secondary cataract, left eye: Secondary | ICD-10-CM | POA: Diagnosis not present

## 2021-10-15 DIAGNOSIS — H34832 Tributary (branch) retinal vein occlusion, left eye, with macular edema: Secondary | ICD-10-CM | POA: Diagnosis not present

## 2021-10-15 DIAGNOSIS — H524 Presbyopia: Secondary | ICD-10-CM | POA: Diagnosis not present

## 2021-10-15 DIAGNOSIS — H40013 Open angle with borderline findings, low risk, bilateral: Secondary | ICD-10-CM | POA: Diagnosis not present

## 2021-10-15 DIAGNOSIS — E119 Type 2 diabetes mellitus without complications: Secondary | ICD-10-CM | POA: Diagnosis not present

## 2021-12-20 DIAGNOSIS — E118 Type 2 diabetes mellitus with unspecified complications: Secondary | ICD-10-CM | POA: Diagnosis not present

## 2021-12-20 DIAGNOSIS — E7800 Pure hypercholesterolemia, unspecified: Secondary | ICD-10-CM | POA: Diagnosis not present

## 2021-12-20 DIAGNOSIS — I10 Essential (primary) hypertension: Secondary | ICD-10-CM | POA: Diagnosis not present

## 2021-12-20 DIAGNOSIS — Z23 Encounter for immunization: Secondary | ICD-10-CM | POA: Diagnosis not present

## 2022-02-19 DIAGNOSIS — I10 Essential (primary) hypertension: Secondary | ICD-10-CM | POA: Diagnosis not present

## 2022-02-19 DIAGNOSIS — E7800 Pure hypercholesterolemia, unspecified: Secondary | ICD-10-CM | POA: Diagnosis not present

## 2022-02-19 DIAGNOSIS — I739 Peripheral vascular disease, unspecified: Secondary | ICD-10-CM | POA: Diagnosis not present

## 2022-02-19 DIAGNOSIS — E1169 Type 2 diabetes mellitus with other specified complication: Secondary | ICD-10-CM | POA: Diagnosis not present

## 2022-02-19 DIAGNOSIS — M810 Age-related osteoporosis without current pathological fracture: Secondary | ICD-10-CM | POA: Diagnosis not present

## 2022-07-31 ENCOUNTER — Encounter (INDEPENDENT_AMBULATORY_CARE_PROVIDER_SITE_OTHER): Payer: Medicare Other | Admitting: Ophthalmology

## 2022-10-15 DIAGNOSIS — E1165 Type 2 diabetes mellitus with hyperglycemia: Secondary | ICD-10-CM | POA: Diagnosis not present

## 2022-10-15 DIAGNOSIS — I10 Essential (primary) hypertension: Secondary | ICD-10-CM | POA: Diagnosis not present

## 2022-10-15 DIAGNOSIS — Z1212 Encounter for screening for malignant neoplasm of rectum: Secondary | ICD-10-CM | POA: Diagnosis not present

## 2022-10-15 DIAGNOSIS — Z780 Asymptomatic menopausal state: Secondary | ICD-10-CM | POA: Diagnosis not present

## 2022-10-24 DIAGNOSIS — I739 Peripheral vascular disease, unspecified: Secondary | ICD-10-CM | POA: Diagnosis not present

## 2022-10-24 DIAGNOSIS — E118 Type 2 diabetes mellitus with unspecified complications: Secondary | ICD-10-CM | POA: Diagnosis not present

## 2022-10-24 DIAGNOSIS — M8580 Other specified disorders of bone density and structure, unspecified site: Secondary | ICD-10-CM | POA: Diagnosis not present

## 2022-10-24 DIAGNOSIS — Z8639 Personal history of other endocrine, nutritional and metabolic disease: Secondary | ICD-10-CM | POA: Diagnosis not present

## 2022-10-24 DIAGNOSIS — R413 Other amnesia: Secondary | ICD-10-CM | POA: Diagnosis not present

## 2022-10-24 DIAGNOSIS — Z23 Encounter for immunization: Secondary | ICD-10-CM | POA: Diagnosis not present

## 2022-10-24 DIAGNOSIS — H02402 Unspecified ptosis of left eyelid: Secondary | ICD-10-CM | POA: Diagnosis not present

## 2022-10-24 DIAGNOSIS — Z0000 Encounter for general adult medical examination without abnormal findings: Secondary | ICD-10-CM | POA: Diagnosis not present

## 2022-10-24 DIAGNOSIS — M810 Age-related osteoporosis without current pathological fracture: Secondary | ICD-10-CM | POA: Diagnosis not present

## 2022-12-01 IMAGING — MG MM DIGITAL DIAGNOSTIC UNILAT*R* W/ TOMO W/ CAD
4 series · 4 of 12 positions shown · non-contrast
Comparison: Previous exams, including mammogram dated 01/16/2009.

CLINICAL DATA: Screening recall for possible right breast
asymmetry.

EXAM:
DIGITAL DIAGNOSTIC UNILATERAL RIGHT MAMMOGRAM WITH TOMOSYNTHESIS AND
CAD; ULTRASOUND RIGHT BREAST LIMITED
TECHNIQUE: Right digital diagnostic mammography and breast tomosynthesis was
performed. The images were evaluated with computer-aided detection.;
Targeted ultrasound examination of the right breast was performed

[R MLO synth-2D]
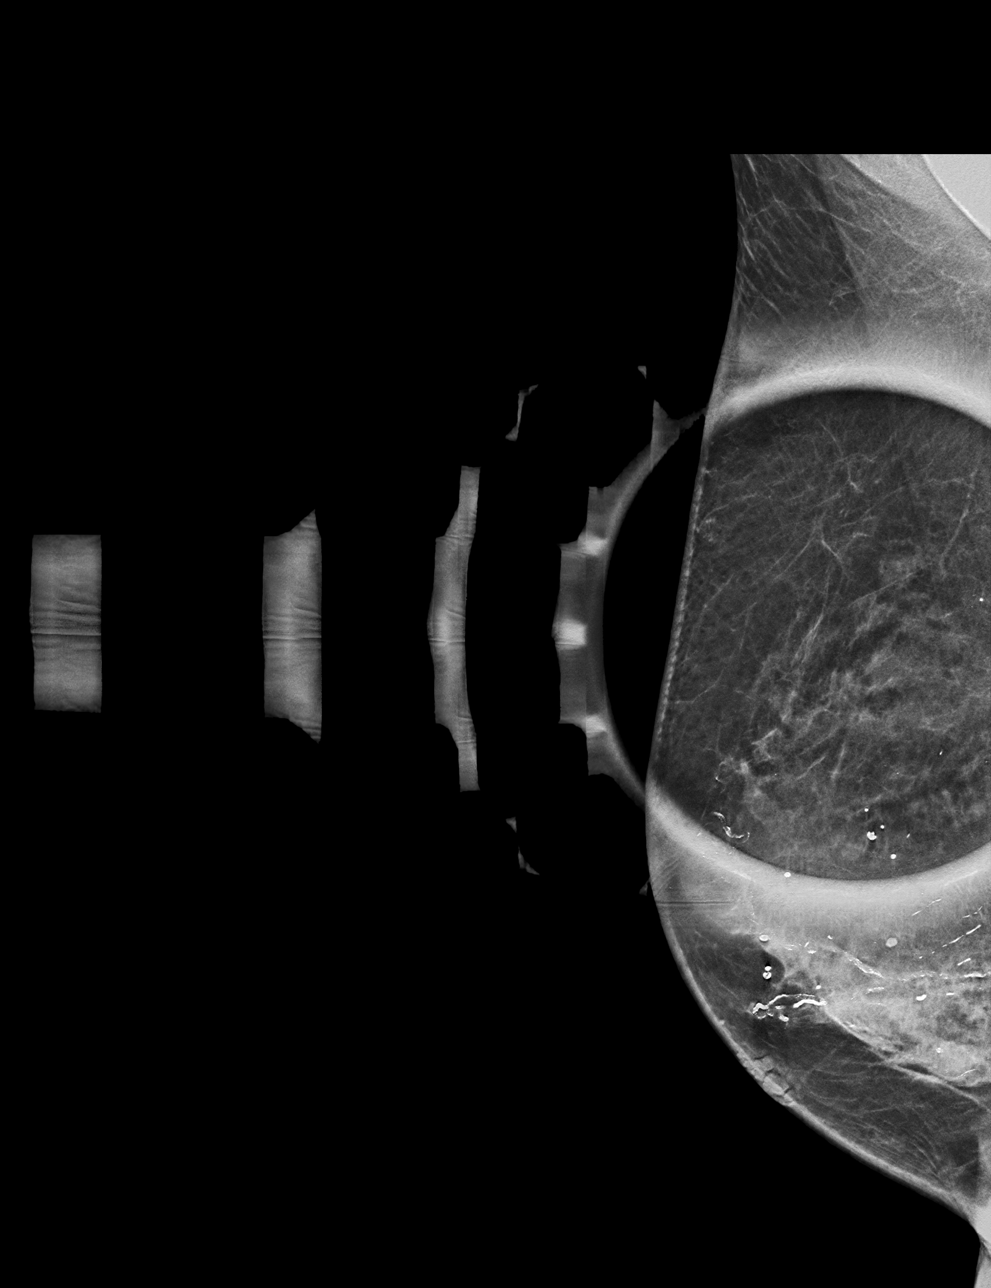

[R ML synth-2D]
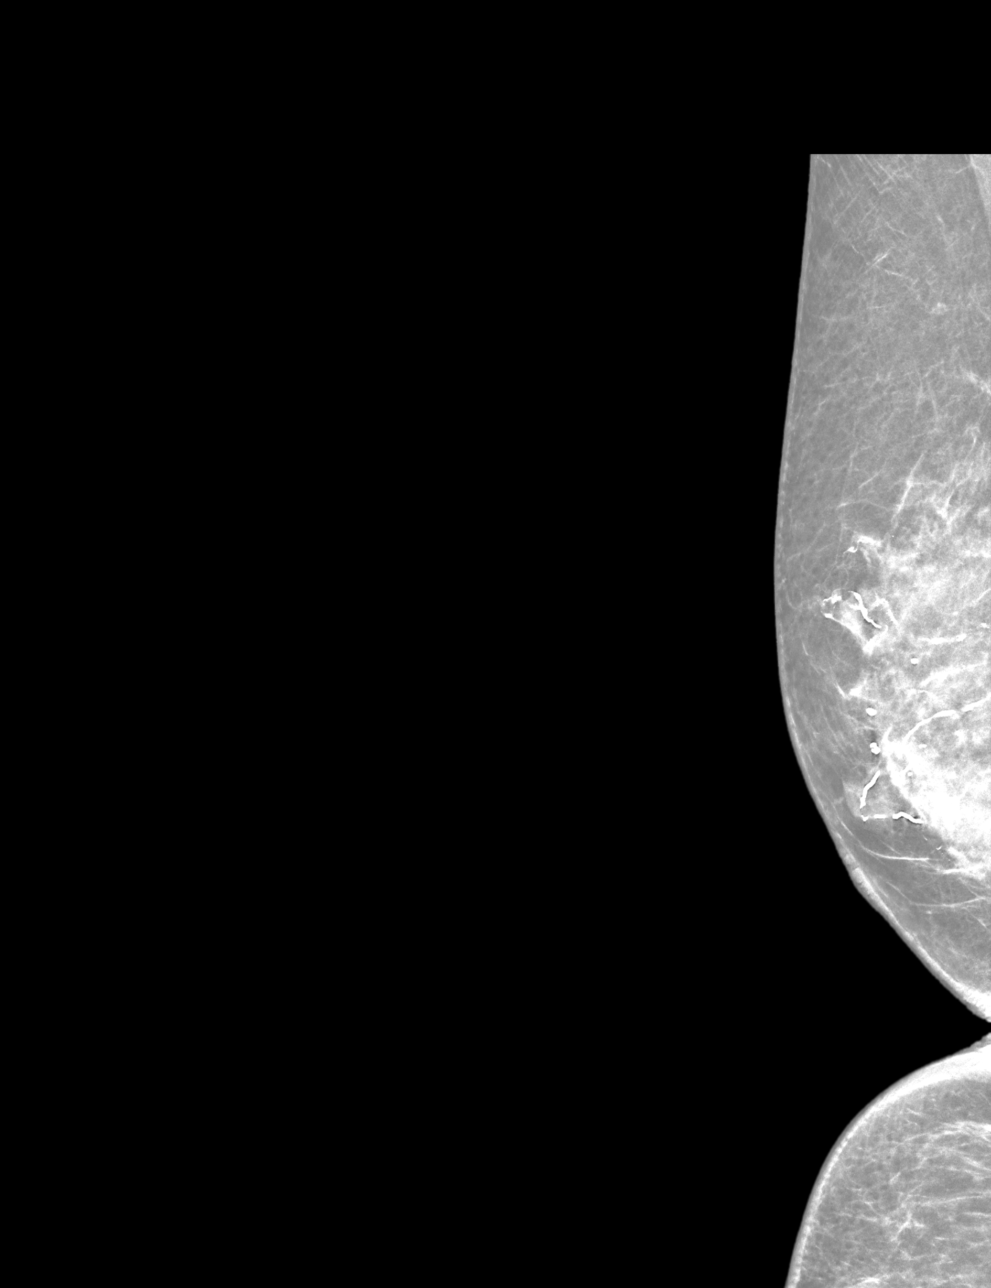

[R MLO tomo · tomo slice 21/41.0]
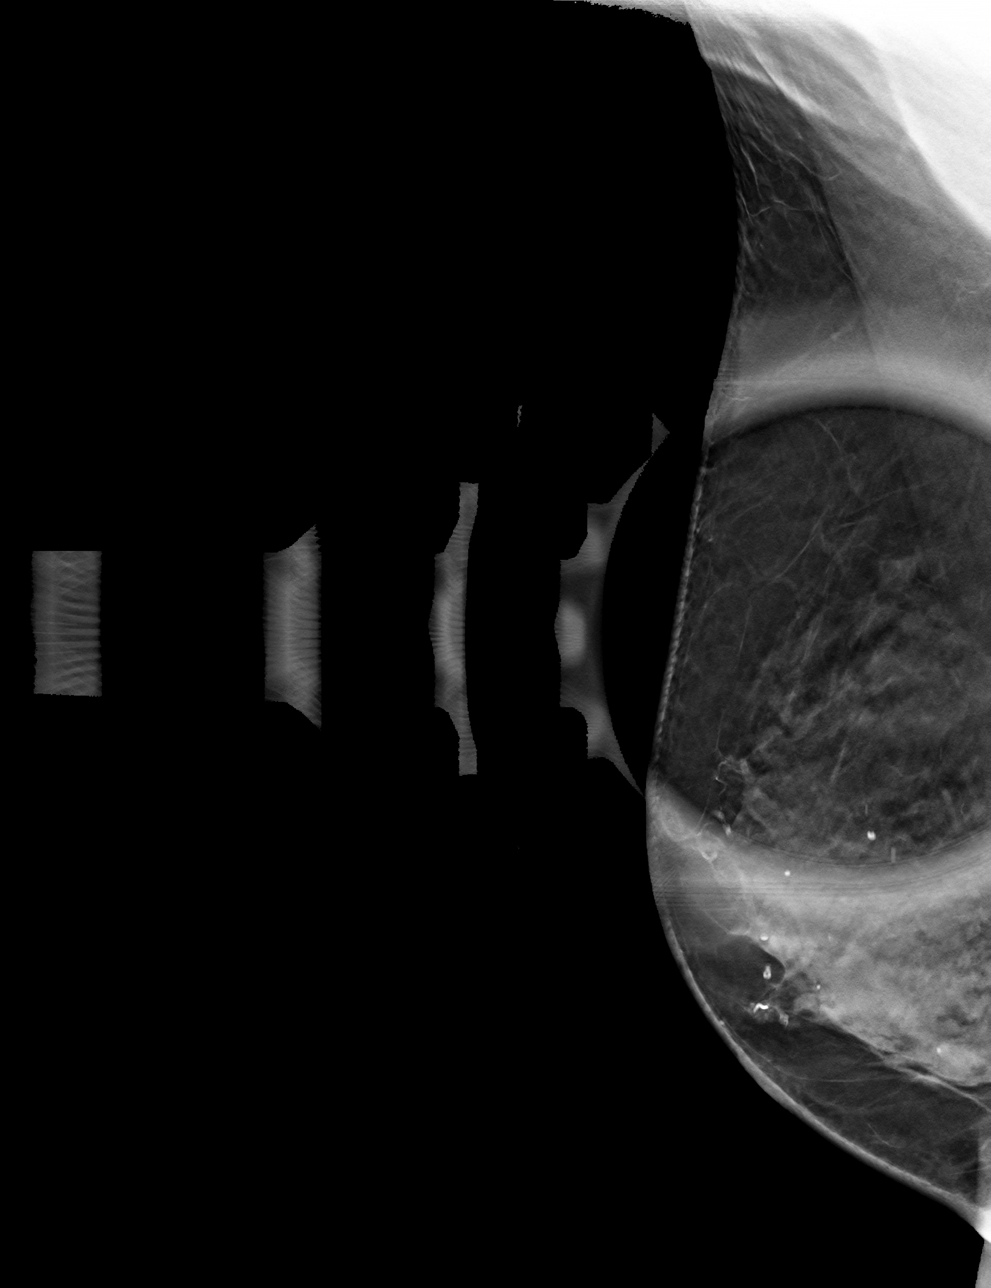

[R ML tomo · tomo slice 21/42.0]
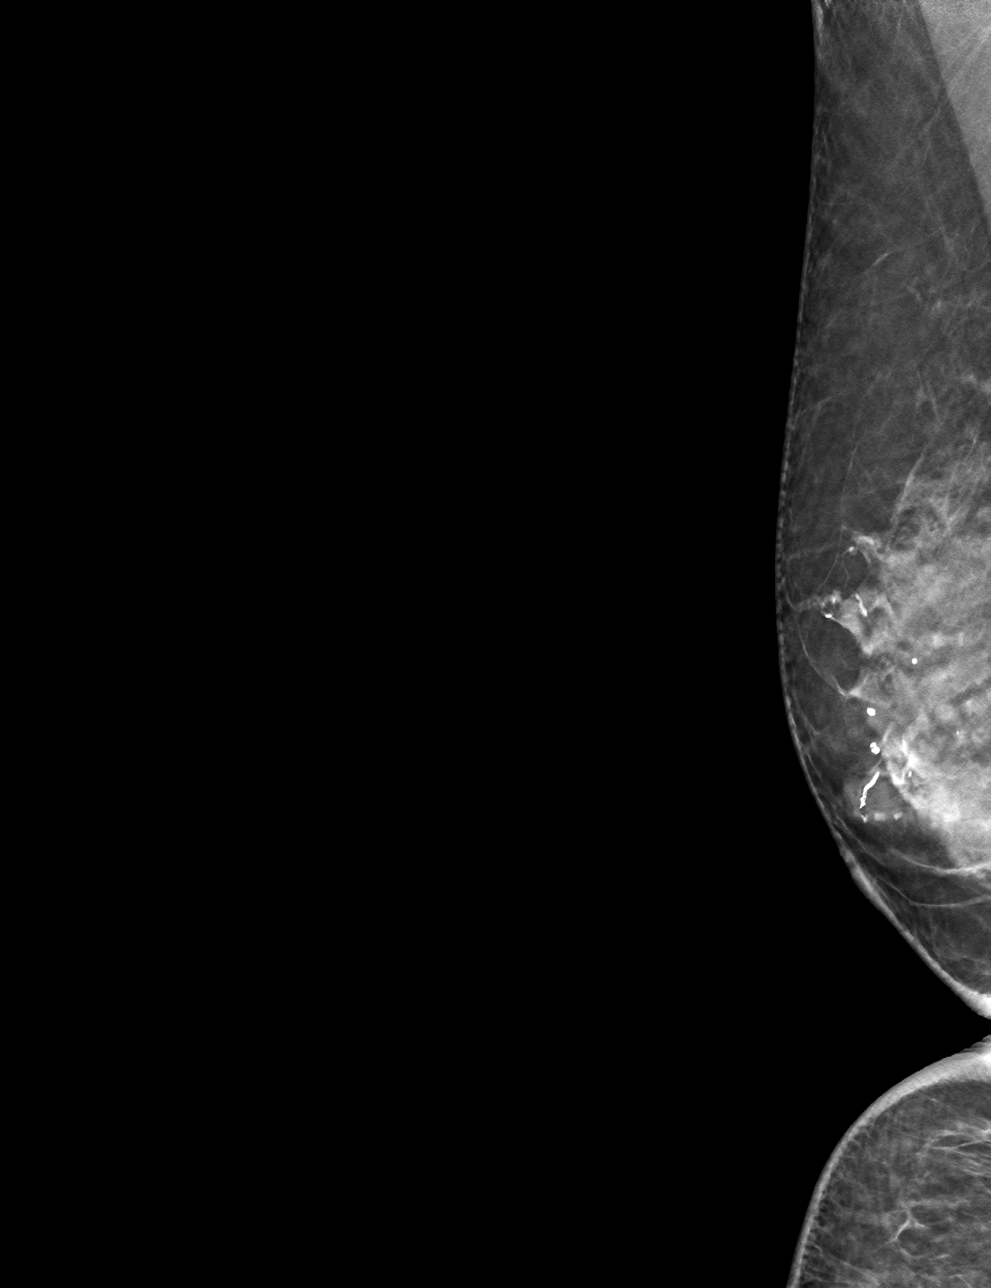

[4 of 12 positions shown; findings below may reference images not displayed]

ACR Breast Density Category c: The breast tissue is heterogeneously
dense, which may obscure small masses.
FINDINGS: Additional tomograms were performed of the right breast. There is an
oval mass in the upper central to slightly outer right breast
measuring 0.5 cm.

Targeted ultrasound of the right breast was performed. There is a
benign appearing intramammary lymph node in the right breast at 11
o'clock 5 cm from nipple measuring 0.6 x 0.4 x 0.7 cm. This
corresponds well with the mass seen in the right breast at
mammography.
IMPRESSION: No findings of malignancy in the right breast.

RECOMMENDATION:
Screening mammogram in one year.(Code:N2-K-GDR)

I have discussed the findings and recommendations with the patient.
If applicable, a reminder letter will be sent to the patient
regarding the next appointment.

BI-RADS CATEGORY  2: Benign.

## 2022-12-01 IMAGING — US US BREAST*R* LIMITED INC AXILLA
1 series · 7 of 7 positions shown · non-contrast
Comparison: Previous exams, including mammogram dated 01/16/2009.

CLINICAL DATA: Screening recall for possible right breast
asymmetry.

EXAM:
DIGITAL DIAGNOSTIC UNILATERAL RIGHT MAMMOGRAM WITH TOMOSYNTHESIS AND
CAD; ULTRASOUND RIGHT BREAST LIMITED
TECHNIQUE: Right digital diagnostic mammography and breast tomosynthesis was
performed. The images were evaluated with computer-aided detection.;
Targeted ultrasound examination of the right breast was performed

[Series 1: us breast*right* limited inc axilla · 0.06mm/px · 7 of 7 slices shown]
[im 1/7]
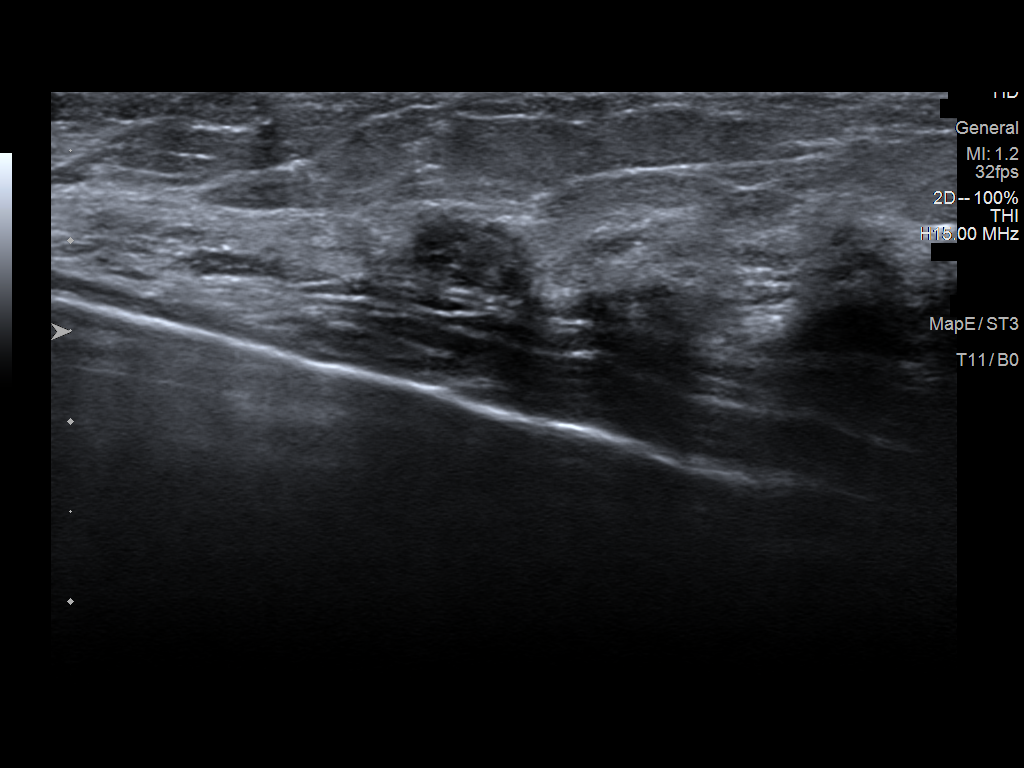
[im 2/7]
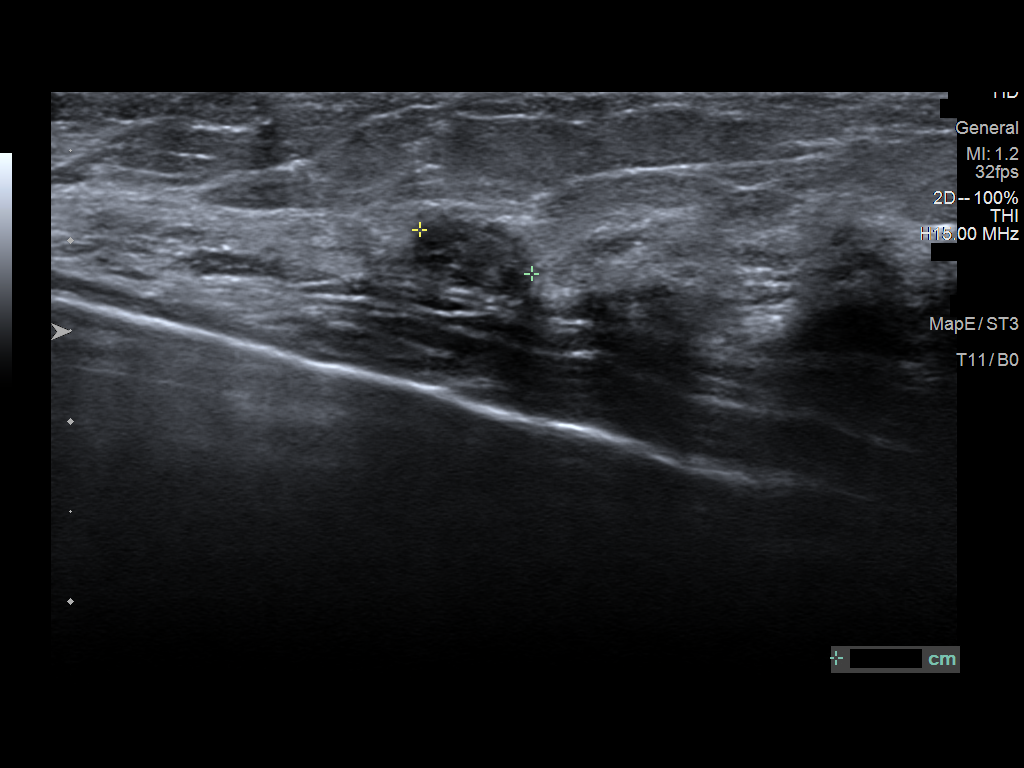
[im 3/7]
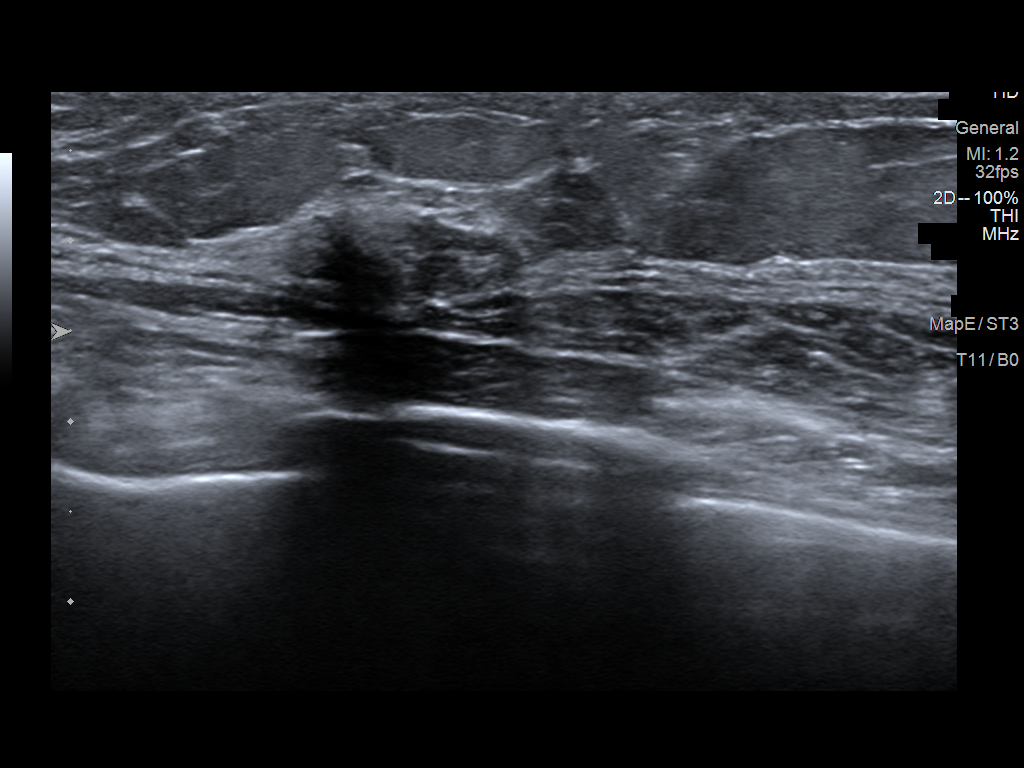
[im 4/7]
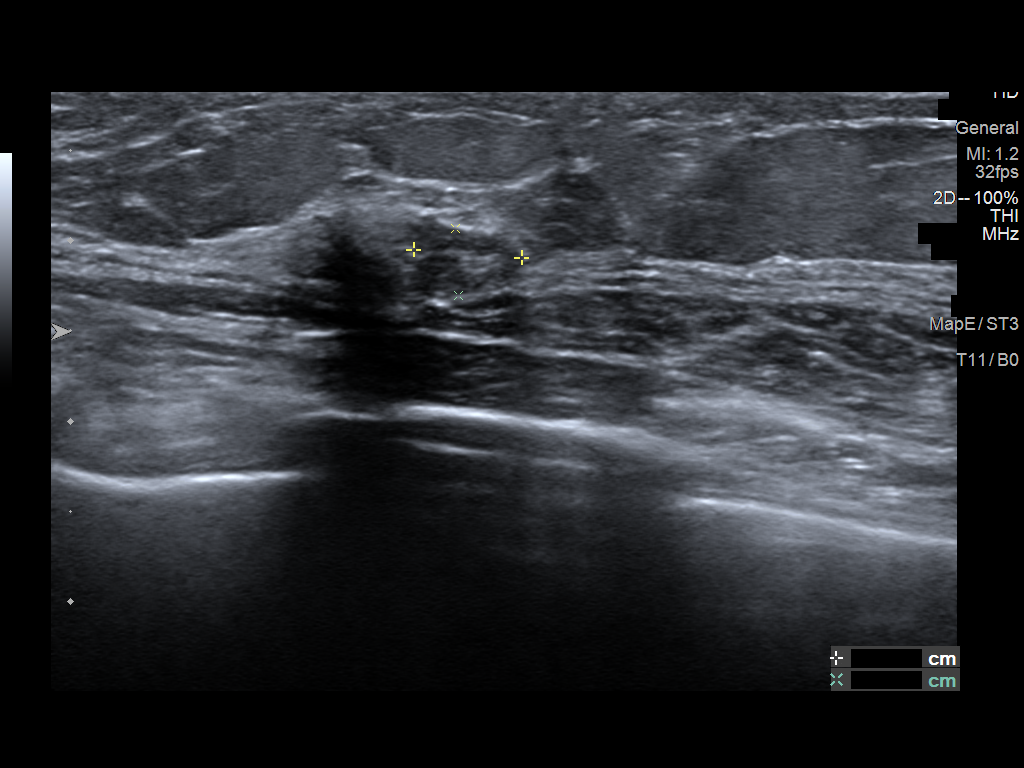
[im 5/7]
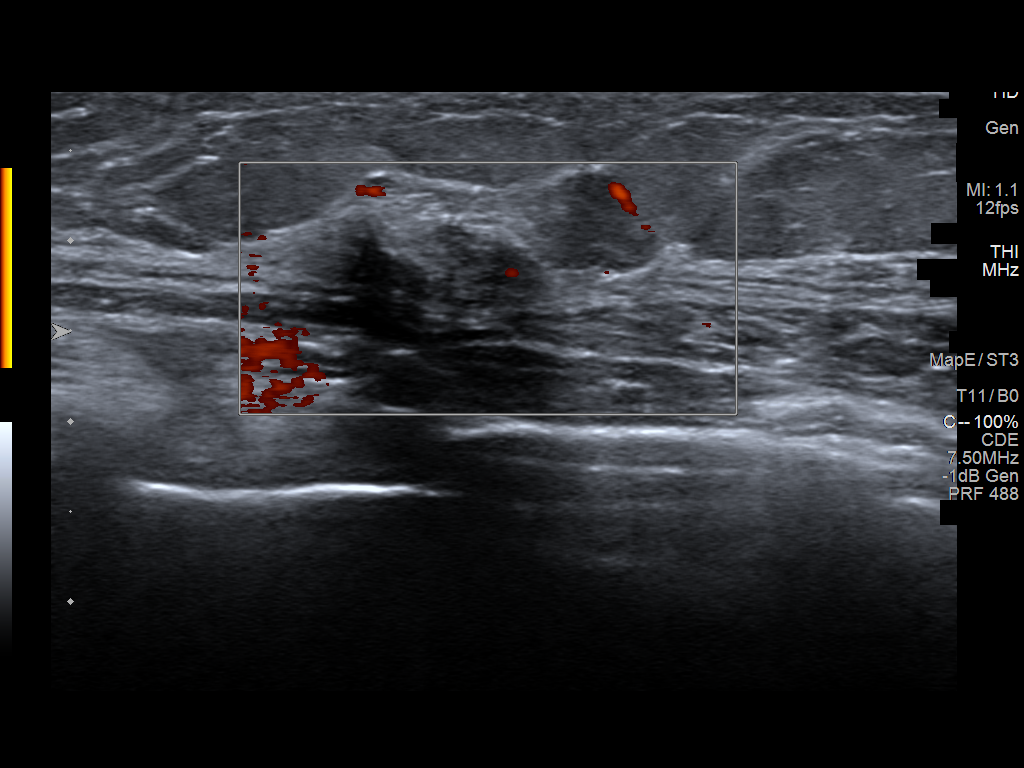
[im 6/7]
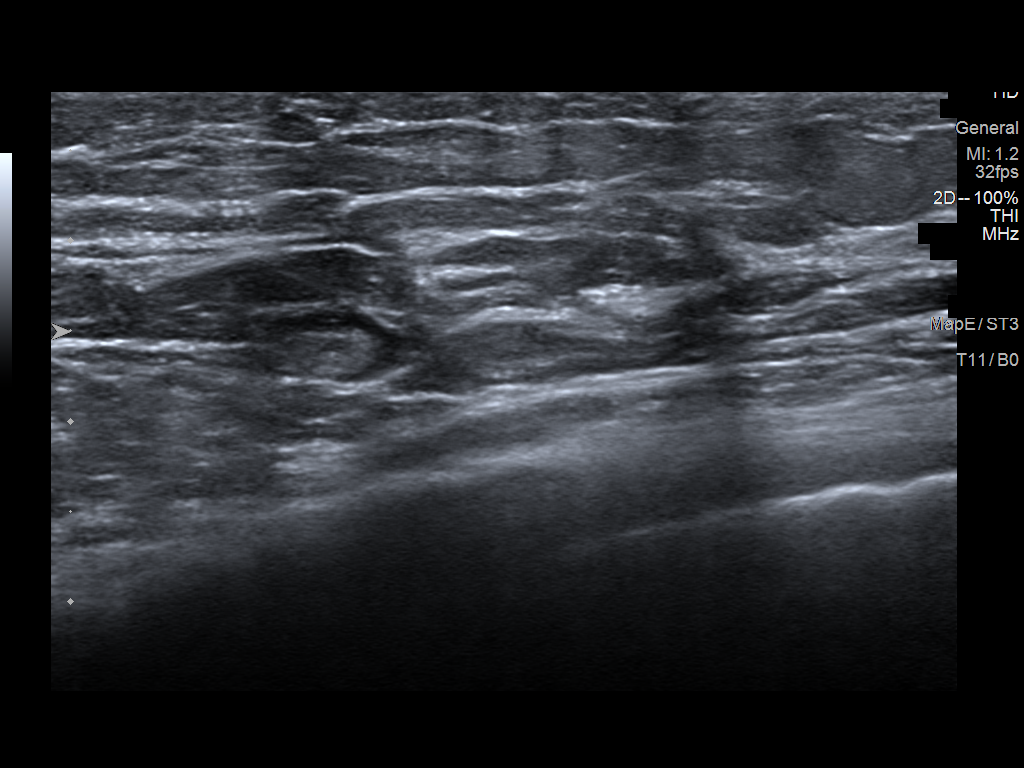
[im 7/7]
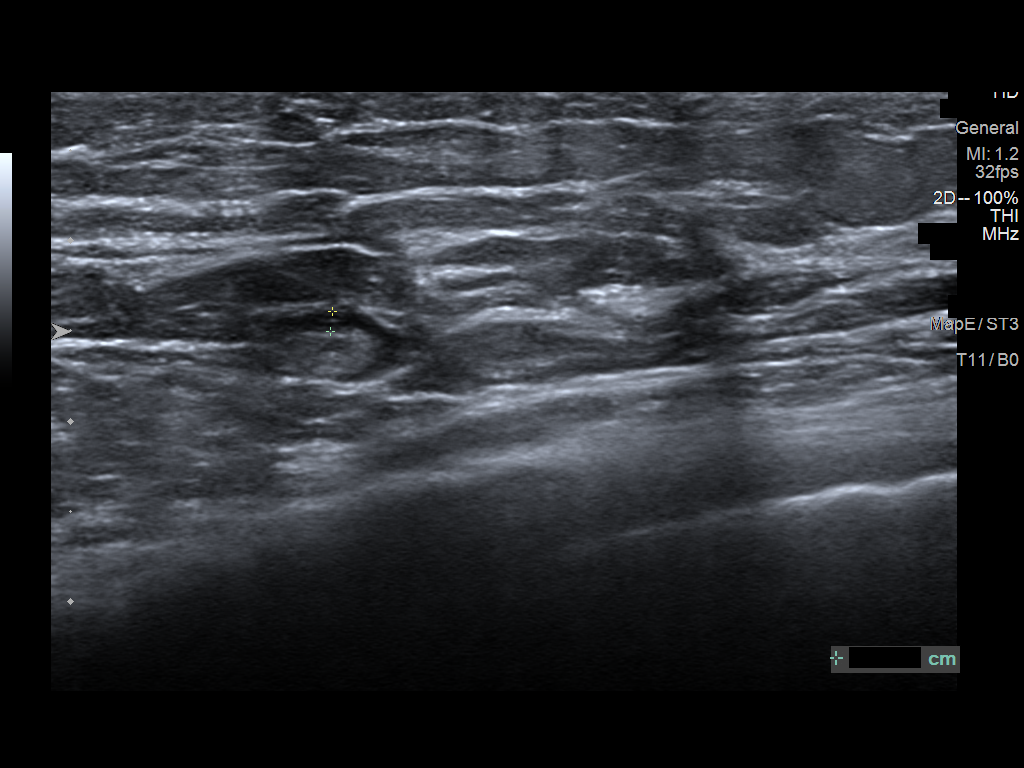

[7 of 7 positions shown; findings below may reference images not displayed]

ACR Breast Density Category c: The breast tissue is heterogeneously
dense, which may obscure small masses.
FINDINGS: Additional tomograms were performed of the right breast. There is an
oval mass in the upper central to slightly outer right breast
measuring 0.5 cm.

Targeted ultrasound of the right breast was performed. There is a
benign appearing intramammary lymph node in the right breast at 11
o'clock 5 cm from nipple measuring 0.6 x 0.4 x 0.7 cm. This
corresponds well with the mass seen in the right breast at
mammography.
IMPRESSION: No findings of malignancy in the right breast.

RECOMMENDATION:
Screening mammogram in one year.(Code:N2-K-GDR)

I have discussed the findings and recommendations with the patient.
If applicable, a reminder letter will be sent to the patient
regarding the next appointment.

BI-RADS CATEGORY  2: Benign.

## 2023-02-25 DIAGNOSIS — I10 Essential (primary) hypertension: Secondary | ICD-10-CM | POA: Diagnosis not present

## 2023-02-25 DIAGNOSIS — I739 Peripheral vascular disease, unspecified: Secondary | ICD-10-CM | POA: Diagnosis not present

## 2023-02-25 DIAGNOSIS — E7800 Pure hypercholesterolemia, unspecified: Secondary | ICD-10-CM | POA: Diagnosis not present

## 2023-02-25 DIAGNOSIS — M810 Age-related osteoporosis without current pathological fracture: Secondary | ICD-10-CM | POA: Diagnosis not present

## 2023-02-25 DIAGNOSIS — E1169 Type 2 diabetes mellitus with other specified complication: Secondary | ICD-10-CM | POA: Diagnosis not present

## 2023-03-20 DIAGNOSIS — H524 Presbyopia: Secondary | ICD-10-CM | POA: Diagnosis not present

## 2023-03-20 DIAGNOSIS — H40013 Open angle with borderline findings, low risk, bilateral: Secondary | ICD-10-CM | POA: Diagnosis not present

## 2023-03-20 DIAGNOSIS — E119 Type 2 diabetes mellitus without complications: Secondary | ICD-10-CM | POA: Diagnosis not present

## 2023-03-20 DIAGNOSIS — H34832 Tributary (branch) retinal vein occlusion, left eye, with macular edema: Secondary | ICD-10-CM | POA: Diagnosis not present

## 2023-03-20 DIAGNOSIS — H353131 Nonexudative age-related macular degeneration, bilateral, early dry stage: Secondary | ICD-10-CM | POA: Diagnosis not present

## 2023-07-01 DIAGNOSIS — I739 Peripheral vascular disease, unspecified: Secondary | ICD-10-CM | POA: Diagnosis not present

## 2023-07-01 DIAGNOSIS — I10 Essential (primary) hypertension: Secondary | ICD-10-CM | POA: Diagnosis not present

## 2023-07-01 DIAGNOSIS — M810 Age-related osteoporosis without current pathological fracture: Secondary | ICD-10-CM | POA: Diagnosis not present

## 2023-07-01 DIAGNOSIS — E1165 Type 2 diabetes mellitus with hyperglycemia: Secondary | ICD-10-CM | POA: Diagnosis not present

## 2023-07-01 DIAGNOSIS — E1169 Type 2 diabetes mellitus with other specified complication: Secondary | ICD-10-CM | POA: Diagnosis not present

## 2023-07-01 DIAGNOSIS — E7800 Pure hypercholesterolemia, unspecified: Secondary | ICD-10-CM | POA: Diagnosis not present

## 2023-10-29 DIAGNOSIS — I10 Essential (primary) hypertension: Secondary | ICD-10-CM | POA: Diagnosis not present

## 2023-10-29 DIAGNOSIS — E1165 Type 2 diabetes mellitus with hyperglycemia: Secondary | ICD-10-CM | POA: Diagnosis not present

## 2023-10-30 DIAGNOSIS — I739 Peripheral vascular disease, unspecified: Secondary | ICD-10-CM | POA: Diagnosis not present

## 2023-10-30 DIAGNOSIS — H02402 Unspecified ptosis of left eyelid: Secondary | ICD-10-CM | POA: Diagnosis not present

## 2023-10-30 DIAGNOSIS — E559 Vitamin D deficiency, unspecified: Secondary | ICD-10-CM | POA: Diagnosis not present

## 2023-10-30 DIAGNOSIS — Z0000 Encounter for general adult medical examination without abnormal findings: Secondary | ICD-10-CM | POA: Diagnosis not present

## 2023-10-30 DIAGNOSIS — E118 Type 2 diabetes mellitus with unspecified complications: Secondary | ICD-10-CM | POA: Diagnosis not present

## 2023-10-30 DIAGNOSIS — M810 Age-related osteoporosis without current pathological fracture: Secondary | ICD-10-CM | POA: Diagnosis not present

## 2023-10-30 DIAGNOSIS — E538 Deficiency of other specified B group vitamins: Secondary | ICD-10-CM | POA: Diagnosis not present

## 2023-10-30 DIAGNOSIS — I10 Essential (primary) hypertension: Secondary | ICD-10-CM | POA: Diagnosis not present

## 2023-10-30 DIAGNOSIS — R413 Other amnesia: Secondary | ICD-10-CM | POA: Diagnosis not present

## 2023-10-30 DIAGNOSIS — Z8639 Personal history of other endocrine, nutritional and metabolic disease: Secondary | ICD-10-CM | POA: Diagnosis not present

## 2024-02-24 DIAGNOSIS — I10 Essential (primary) hypertension: Secondary | ICD-10-CM | POA: Diagnosis not present

## 2024-02-24 DIAGNOSIS — E1165 Type 2 diabetes mellitus with hyperglycemia: Secondary | ICD-10-CM | POA: Diagnosis not present

## 2024-02-24 DIAGNOSIS — Z79899 Other long term (current) drug therapy: Secondary | ICD-10-CM | POA: Diagnosis not present

## 2024-02-24 DIAGNOSIS — E7800 Pure hypercholesterolemia, unspecified: Secondary | ICD-10-CM | POA: Diagnosis not present

## 2024-02-24 DIAGNOSIS — M810 Age-related osteoporosis without current pathological fracture: Secondary | ICD-10-CM | POA: Diagnosis not present

## 2024-02-24 DIAGNOSIS — E1169 Type 2 diabetes mellitus with other specified complication: Secondary | ICD-10-CM | POA: Diagnosis not present

## 2024-03-23 ENCOUNTER — Other Ambulatory Visit: Payer: Self-pay

## 2024-03-23 ENCOUNTER — Emergency Department (HOSPITAL_COMMUNITY)
Admission: EM | Admit: 2024-03-23 | Discharge: 2024-03-23 | Disposition: A | Discharge status: Home / Self Care | Attending: Emergency Medicine | Admitting: Emergency Medicine

## 2024-03-23 ENCOUNTER — Encounter (HOSPITAL_COMMUNITY): Payer: Self-pay | Admitting: Emergency Medicine

## 2024-03-23 DIAGNOSIS — E876 Hypokalemia: Secondary | ICD-10-CM | POA: Diagnosis not present

## 2024-03-23 DIAGNOSIS — Z7982 Long term (current) use of aspirin: Secondary | ICD-10-CM | POA: Diagnosis not present

## 2024-03-23 DIAGNOSIS — I4719 Other supraventricular tachycardia: Secondary | ICD-10-CM | POA: Insufficient documentation

## 2024-03-23 DIAGNOSIS — Z79899 Other long term (current) drug therapy: Secondary | ICD-10-CM | POA: Insufficient documentation

## 2024-03-23 DIAGNOSIS — I499 Cardiac arrhythmia, unspecified: Secondary | ICD-10-CM | POA: Diagnosis not present

## 2024-03-23 DIAGNOSIS — R030 Elevated blood-pressure reading, without diagnosis of hypertension: Secondary | ICD-10-CM

## 2024-03-23 DIAGNOSIS — I10 Essential (primary) hypertension: Secondary | ICD-10-CM | POA: Insufficient documentation

## 2024-03-23 DIAGNOSIS — R000 Tachycardia, unspecified: Secondary | ICD-10-CM | POA: Diagnosis not present

## 2024-03-23 LAB — CBC
HCT: 37.8 % (ref 36.0–46.0)
Hemoglobin: 12.4 g/dL (ref 12.0–15.0)
MCH: 30.1 pg (ref 26.0–34.0)
MCHC: 32.8 g/dL (ref 30.0–36.0)
MCV: 91.7 fL (ref 80.0–100.0)
Platelets: 189 K/uL (ref 150–400)
RBC: 4.12 MIL/uL (ref 3.87–5.11)
RDW: 14.6 % (ref 11.5–15.5)
WBC: 5.8 K/uL (ref 4.0–10.5)
nRBC: 0 % (ref 0.0–0.2)

## 2024-03-23 LAB — BASIC METABOLIC PANEL WITH GFR
Anion gap: 11 (ref 5–15)
BUN: 19 mg/dL (ref 8–23)
CO2: 26 mmol/L (ref 22–32)
Calcium: 8.7 mg/dL — ABNORMAL LOW (ref 8.9–10.3)
Chloride: 103 mmol/L (ref 98–111)
Creatinine, Ser: 0.85 mg/dL (ref 0.44–1.00)
GFR, Estimated: >60 mL/min (ref >60)
Glucose, Bld: 142 mg/dL — ABNORMAL HIGH (ref 70–99)
Potassium: 3.4 mmol/L — ABNORMAL LOW (ref 3.5–5.1)
Sodium: 140 mmol/L (ref 135–145)

## 2024-03-23 LAB — TSH: TSH: 1.138 u[IU]/mL (ref 0.350–4.500)

## 2024-03-23 MED ORDER — METOPROLOL SUCCINATE ER 25 MG PO TB24
50.0000 mg | ORAL_TABLET | Freq: Once | ORAL | Status: AC
  Administered 2024-03-23 (×2): 50 mg via ORAL
  Filled 2024-03-23: qty 2

## 2024-03-23 MED ORDER — POTASSIUM CHLORIDE 20 MEQ PO PACK
40.0000 meq | PACK | Freq: Once | ORAL | Status: AC
  Administered 2024-03-23 (×2): 40 meq via ORAL
  Filled 2024-03-23: qty 2

## 2024-03-23 MED ORDER — METOPROLOL SUCCINATE ER 50 MG PO TB24: 50.0000 mg | ORAL_TABLET | Freq: Every day | ORAL | 0 refills | Status: AC

## 2024-03-23 NOTE — ED Triage Notes (Signed)
 According to [facility]: Pt was at pt primary provider when hr was discovered to be 175. Ems palpated a rate of 182. Pt claims no cardiac history but is prescribed hydrochlorothiazide but does not take it. Pt asymptomatic and denies all symptoms. Pt given 6mg  adenosine and then 12 mg of adenosine during ride and a bolus of 500 ml of ns. Pt converted with second dosage of adenosines now has a rate of 96.  Pt has 18 gauge in left a.  Vitals: 154/100 Hr 96 Rr 16 Spo2 93 on room air, 96% on 2 L.

## 2024-03-23 NOTE — ED Provider Notes (Addendum)
 [facility] Provider Note   CSN: [id number] Arrival date & time: 03/23/24  1041     Patient presents with: Tachycardia   Angel Forbes is a 89 y.o. female.   Pt reportedly at doctors office where was found to have heart rate in 170s. Pt indicates she felt fine, was asymptomatic. Denies hx dysrhythmia. No hx svt or afib. No current or recent chest pain or discomfort. No hx syncope.  No heat intolerance, sweats or wt loss. No recent change in meds.  EMS was called, and after 2nd dose of adenosine patient converted to sinus rhythm.   The history is provided by the patient.       Prior to Admission medications   Medication Sig Start Date End Date Taking? Authorizing Provider  aspirin 81 MG chewable tablet Chew 1 tablet by mouth daily. 11/01/20  Yes [provider]  metFORMIN (GLUCOPHAGE-XR) 500 MG 24 hr tablet Take 1,000 mg by mouth daily with breakfast.   Yes [provider]  metoprolol  succinate (TOPROL -XL) 50 MG 24 hr tablet Take 1 tablet (50 mg total) by mouth daily. 03/24/24  Yes Onofre Gains, MD  rosuvastatin (CRESTOR) 10 MG tablet Take 1 tablet by mouth daily. Patient not taking: Reported on 03/23/2024 09/21/20   [provider]  valsartan-hydrochlorothiazide (DIOVAN-HCT) 160-25 MG tablet Take 1 tablet by mouth daily. Patient not taking: Reported on 03/23/2024 07/26/20   [provider]    Allergies: Patient has no known allergies.    Review of Systems  Constitutional:  Negative for chills and fever.  Eyes:  Negative for visual disturbance.  Respiratory:  Negative for cough and shortness of breath.   Cardiovascular:  Negative for chest pain.  Gastrointestinal:  Negative for abdominal pain, nausea and vomiting.  Genitourinary:  Negative for dysuria and flank pain.  Musculoskeletal:  Negative for back pain and neck pain.  Neurological:  Negative for syncope and headaches.    Updated Vital  Signs BP 137/77   Pulse 83   Temp (!) 97.5 F (36.4 C) (Oral)   Resp 20   Ht 1.499 m (4' 11)   Wt 54.4 kg   SpO2 100%   BMI 24.24 kg/m   Physical Exam Vitals and nursing note reviewed.  Constitutional:      Appearance: Normal appearance. She is well-developed.  HENT:     Head: Atraumatic.     Nose: Nose normal.     Mouth/Throat:     Mouth: Mucous membranes are moist.  Eyes:     General: No scleral icterus.    Conjunctiva/sclera: Conjunctivae normal.     Pupils: Pupils are equal, round, and reactive to light.  Neck:     Vascular: No carotid bruit.     Trachea: No tracheal deviation.     Comments: Trachea midline, thyroid  not grossly enlarged or tender.  Cardiovascular:     Rate and Rhythm: Normal rate and regular rhythm.     Pulses: Normal pulses.     Heart sounds: Normal heart sounds. No murmur heard.    No friction rub. No gallop.  Pulmonary:     Effort: Pulmonary effort is normal. No respiratory distress.     Breath sounds: Normal breath sounds.  Abdominal:     General: There is no distension.     Palpations: Abdomen is soft.     Tenderness: There is no abdominal tenderness.  Genitourinary:    Comments: No cva tenderness.  Musculoskeletal:  General: No swelling or tenderness.     Cervical back: Normal range of motion and neck supple. No rigidity. No muscular tenderness.     Right lower leg: No edema.     Left lower leg: No edema.  Skin:    General: Skin is warm and dry.     Findings: No rash.  Neurological:     Mental Status: She is alert.     Comments: Alert, speech normal. Motor/sens grossly intact bil.   Psychiatric:        Mood and Affect: Mood normal.     (all labs ordered are listed, but only abnormal results are displayed) Results for orders placed or performed during the hospital encounter of 03/23/24  CBC   Collection Time: 03/23/24 11:10 AM  Result Value Ref Range   WBC 5.8 4.0 - 10.5 K/uL   RBC 4.12 3.87 - 5.11 MIL/uL   Hemoglobin  12.4 12.0 - 15.0 g/dL   HCT 62.1 63.9 - 53.9 %   MCV 91.7 80.0 - 100.0 fL   MCH 30.1 26.0 - 34.0 pg   MCHC 32.8 30.0 - 36.0 g/dL   RDW 85.3 88.4 - 84.4 %   Platelets 189 150 - 400 K/uL   nRBC 0.0 0.0 - 0.2 %  Basic metabolic panel with GFR   Collection Time: 03/23/24 11:10 AM  Result Value Ref Range   Sodium 140 135 - 145 mmol/L   Potassium 3.4 (L) 3.5 - 5.1 mmol/L   Chloride 103 98 - 111 mmol/L   CO2 26 22 - 32 mmol/L   Glucose, Bld 142 (H) 70 - 99 mg/dL   BUN 19 8 - 23 mg/dL   Creatinine, Ser 9.14 0.44 - 1.00 mg/dL   Calcium 8.7 (L) 8.9 - 10.3 mg/dL   GFR, Estimated >39 >39 mL/min   Anion gap 11 5 - 15  TSH   Collection Time: 03/23/24 11:30 AM  Result Value Ref Range   TSH 1.138 0.350 - 4.500 uIU/mL   No results found.   EKG: EKG Interpretation Date/Time:  Tuesday March 23 2024 10:51:32 EDT Ventricular Rate:  99 PR Interval:  182 QRS Duration:  76 QT Interval:  375 QTC Calculation: 482 R Axis:   30  Text Interpretation: Sinus tachycardia Atrial premature complex No previous tracing Confirmed by Bernard Drivers ([id number]) on 03/23/2024 11:00:07 AM  Radiology: No results found.   Procedures   Medications Ordered in the ED  potassium chloride  (KLOR-CON ) packet 40 mEq (has no administration in time range)  metoprolol  succinate (TOPROL -XL) 24 hr tablet 50 mg (50 mg Oral Given 03/23/24 1252)                                    Medical Decision Making Problems Addressed: Elevated blood pressure reading: acute illness or injury Essential hypertension: chronic illness or injury with exacerbation, progression, or side effects of treatment that poses a threat to life or bodily functions Hypokalemia: acute illness or injury Narrow complex tachycardia (HCC): acute illness or injury with systemic symptoms that poses a threat to life or bodily functions  Amount and/or Complexity of Data Reviewed Independent Historian: EMS    Details: hx External Data Reviewed:  notes. Labs: ordered. Decision-making details documented in ED Course. ECG/medicine tests: ordered and independent interpretation performed. Decision-making details documented in ED Course. Discussion of management or test interpretation with external provider(s): cardiology  Risk Prescription drug management.  Decision regarding hospitalization.   Iv ns. Continuous pulse ox and cardiac monitoring. Labs ordered/sent.   Differential diagnosis includes svt, afib, etc. Dispo decision including potential need for admission considered - will get labs and reassess.   Reviewed nursing notes and prior charts for additional history. External reports reviewed. Additional history from: EMS.   Chadsvasc = 5  Cardiac monitor: sinus rhythm, rate 90.  Labs reviewed/interpreted by me - wbc and hgb normal. K mildly low. Kcl po.   Cardiology consulted - discussed pt, they/Dr Jeffrie reviewed strips, and indicate would not start anticoagulation therapy, but would start metoprolol  50 mg a day, and they will f/u in clinic.   Recheck pt, no symptoms, remains in nsr.   Pt currently appears stable for ed d/c.    Rec close pcp/cardiology f/u.  Return precautions provided.       Final diagnoses:  Narrow complex tachycardia (HCC)  Elevated blood pressure reading  Essential hypertension    ED Discharge Orders          Ordered    metoprolol  succinate (TOPROL -XL) 50 MG 24 hr tablet  Daily        03/23/24 1223    Ambulatory referral to Cardiology       Comments: If you have not heard from the Cardiology office within the next 72 hours please call [phone].   03/23/24 1222                Bernard Drivers, MD 03/23/24 1325

## 2024-03-23 NOTE — Discharge Instructions (Addendum)
 It was our pleasure to provide your ER care today - we hope that you feel better. Drink adequate fluids/stay well hydrated. Minimize caffeine use.   For recent fast heart rhythm, we discussed your case with our cardiologists - they indicate for you to take metoprolol  as prescribed, and follow up closely with them in the office in the next 1-2 weeks - we made referral, and they should be contacting you with an appointment in the next few days.   From today's labs, your potassium level is mildly low - eat plenty of fruits and vegetables, and follow up with your doctor in 1-2 weeks.   Return to ER right away if worse, new symptoms, fevers, recurrent/persistent chest pain, increased trouble breathing, or other concern.

## 2024-03-23 NOTE — ED Notes (Signed)
 Paper work reviewed with pt. Pt left in no new onset distress. Pt refused cab voucher and bus token.

## 2024-04-01 ENCOUNTER — Ambulatory Visit: Admitting: Cardiology

## 2024-04-05 ENCOUNTER — Ambulatory Visit: Attending: Cardiology | Admitting: Cardiology

## 2024-05-27 DIAGNOSIS — E876 Hypokalemia: Secondary | ICD-10-CM | POA: Diagnosis not present

## 2024-05-27 DIAGNOSIS — I471 Supraventricular tachycardia, unspecified: Secondary | ICD-10-CM | POA: Diagnosis not present

## 2024-05-27 DIAGNOSIS — M810 Age-related osteoporosis without current pathological fracture: Secondary | ICD-10-CM | POA: Diagnosis not present

## 2024-05-27 DIAGNOSIS — R413 Other amnesia: Secondary | ICD-10-CM | POA: Diagnosis not present

## 2024-07-18 NOTE — Progress Notes (Deleted)
  Electrophysiology Office Note:    Date:  07/18/2024   ID:  Angel, Forbes 1930-06-12, MRN [id number]  PCP:  Clarice Nottingham, MD   [facility] Providers Cardiologist:  None { Click to update primary MD,subspecialty MD or APP then REFRESH:1}    Referring MD: Clarice Nottingham, MD   History of Present Illness:    Angel Forbes is a 89 y.o. female with a medical history significant for ***, referred for ***.     She presented to the ER in August 2025 from her PCP where she was found to have a heart rate of about 170 bpm.  She was given 2 doses of adenosine before converting to sinus rhythm.  ECG strip is scanned under her ER visit note that day.  She was started on metoprolol   Discussed the use of AI scribe software for clinical note transcription with the patient, who gave verbal consent to proceed.  History of Present Illness          Today, ***  EKGs/Labs/Other Studies Reviewed Today:     Echocardiogram:  *** ***   Monitors:  *** day monitor ***  -- my interpretation ***  Stress testing:  *** ***  Advanced imaging:  *** ***  Cardiac catherization  *** ***  EKG:         Physical Exam:    VS:  There were no vitals taken for this visit.    Wt Readings from Last 3 Encounters:  03/23/24 120 lb (54.4 kg)  12/30/16 117 lb (53.1 kg)  11/18/16 117 lb (53.1 kg)     GEN: *** Well nourished, well developed in no acute distress CARDIAC: ***RRR, no murmurs, rubs, gallops RESPIRATORY:  Normal work of breathing MUSCULOSKELETAL: *** edema    ASSESSMENT & PLAN:     SVT Converted to sinus rhythm with adenosine  *** ***  *** ***  *** ***  *** ***    Signed, Eulas FORBES Furbish, MD  07/18/2024 3:44 PM     [facility]

## 2024-07-19 ENCOUNTER — Ambulatory Visit: Attending: Cardiovascular Disease | Admitting: Cardiovascular Disease

## 2024-07-19 ENCOUNTER — Encounter: Payer: Self-pay | Admitting: Cardiovascular Disease

## 2024-08-23 ENCOUNTER — Other Ambulatory Visit: Payer: Self-pay | Admitting: Internal Medicine

## 2024-08-23 DIAGNOSIS — R413 Other amnesia: Secondary | ICD-10-CM

## 2024-09-21 ENCOUNTER — Other Ambulatory Visit

## 2024-10-04 ENCOUNTER — Ambulatory Visit: Admitting: Neurology
